# Patient Record
Sex: Female | Born: 1965
Health system: Southern US, Community
[De-identification: ages and names within clinical notes are randomized; demographics above are authoritative.]

## PROBLEM LIST (undated history)

## (undated) DIAGNOSIS — R87619 Unspecified abnormal cytological findings in specimens from cervix uteri: Secondary | ICD-10-CM

## (undated) DIAGNOSIS — H9190 Unspecified hearing loss, unspecified ear: Secondary | ICD-10-CM

## (undated) DIAGNOSIS — E785 Hyperlipidemia, unspecified: Secondary | ICD-10-CM

## (undated) HISTORY — DX: Unspecified hearing loss, unspecified ear: H91.90

## (undated) HISTORY — DX: Unspecified abnormal cytological findings in specimens from cervix uteri: R87.619

## (undated) HISTORY — PX: LEEP: SHX91

## (undated) HISTORY — PX: OVARIAN CYST REMOVAL: SHX89

## (undated) HISTORY — DX: Hyperlipidemia, unspecified: E78.5

---

## 2017-12-21 ENCOUNTER — Ambulatory Visit: Payer: Self-pay | Admitting: Family Medicine

## 2017-12-21 ENCOUNTER — Ambulatory Visit (INDEPENDENT_AMBULATORY_CARE_PROVIDER_SITE_OTHER): Payer: No Typology Code available for payment source | Admitting: Family Medicine

## 2017-12-21 VITALS — BP 133/81 | HR 90 | Resp 17 | Ht 63.0 in | Wt 125.8 lb

## 2017-12-21 DIAGNOSIS — L989 Disorder of the skin and subcutaneous tissue, unspecified: Secondary | ICD-10-CM | POA: Diagnosis not present

## 2017-12-21 DIAGNOSIS — Z1389 Encounter for screening for other disorder: Secondary | ICD-10-CM | POA: Diagnosis not present

## 2017-12-21 DIAGNOSIS — Z13220 Encounter for screening for lipoid disorders: Secondary | ICD-10-CM | POA: Diagnosis not present

## 2017-12-21 DIAGNOSIS — Z8249 Family history of ischemic heart disease and other diseases of the circulatory system: Secondary | ICD-10-CM

## 2017-12-21 DIAGNOSIS — E559 Vitamin D deficiency, unspecified: Secondary | ICD-10-CM

## 2017-12-21 DIAGNOSIS — Z0000 Encounter for general adult medical examination without abnormal findings: Secondary | ICD-10-CM

## 2017-12-21 DIAGNOSIS — Z7689 Persons encountering health services in other specified circumstances: Secondary | ICD-10-CM

## 2017-12-21 DIAGNOSIS — F4322 Adjustment disorder with anxiety: Secondary | ICD-10-CM

## 2017-12-21 DIAGNOSIS — Z1239 Encounter for other screening for malignant neoplasm of breast: Secondary | ICD-10-CM

## 2017-12-21 DIAGNOSIS — Z1321 Encounter for screening for nutritional disorder: Secondary | ICD-10-CM

## 2017-12-21 DIAGNOSIS — Z131 Encounter for screening for diabetes mellitus: Secondary | ICD-10-CM

## 2017-12-21 LAB — POCT URINALYSIS DIP (CLINITEK)
BILIRUBIN UA: NEGATIVE
Glucose, UA: NEGATIVE mg/dL
Ketones, POC UA: NEGATIVE mg/dL
Nitrite, UA: NEGATIVE
POC PROTEIN,UA: NEGATIVE
Spec Grav, UA: 1.025 (ref 1.010–1.025)
Urobilinogen, UA: 0.2 E.U./dL
pH, UA: 6 (ref 5.0–8.0)

## 2017-12-21 NOTE — Patient Instructions (Addendum)
Thank you for choosing [facility] Care at [facility] to be your medical home!    Rebecca Mcdowell was seen by Molli Barrows, FNP today.   Rebecca Mcdowell's primary care provider is Scot Jun, FNP.   For the best care possible, you should try to see Molli Barrows, FNP-C whenever you come to the clinic.   We look forward to seeing you again soon!  If you have any questions about your visit today, please call us at [phone] or feel free to reach your primary care provider via [place].     To schedule your breast exam, please call Breast Clinic at   Phone: [phone]   To schedule an Eye exam [facility]    Health Maintenance, Female Adopting a healthy lifestyle and getting preventive care can go a long way to promote health and wellness. Talk with your health care provider about what schedule of regular examinations is right for you. This is a good chance for you to check in with your provider about disease prevention and staying healthy. In between checkups, there are plenty of things you can do on your own. Experts have done a lot of research about which lifestyle changes and preventive measures are most likely to keep you healthy. Ask your health care provider for more information. Weight and diet Eat a healthy diet  Be sure to include plenty of vegetables, fruits, low-fat dairy products, and lean protein.  Do not eat a lot of foods high in solid fats, added sugars, or salt.  Get regular exercise. This is one of the most important things you can do for your health. ? Most adults should exercise for at least 150 minutes each week. The exercise should increase your heart rate and make you sweat (moderate-intensity exercise). ? Most adults should also do strengthening exercises at least twice a week. This is in addition to the moderate-intensity exercise.  Maintain a healthy weight  Body mass index (BMI) is a measurement that can be used to  identify possible weight problems. It estimates body fat based on height and weight. Your health care provider can help determine your BMI and help you achieve or maintain a healthy weight.  For females 46 years of age and older: ? A BMI below 18.5 is considered underweight. ? A BMI of 18.5 to 24.9 is normal. ? A BMI of 25 to 29.9 is considered overweight. ? A BMI of 30 and above is considered obese.  Watch levels of cholesterol and blood lipids  You should start having your blood tested for lipids and cholesterol at 50 years of age, then have this test every 5 years.  You may need to have your cholesterol levels checked more often if: ? Your lipid or cholesterol levels are high. ? You are older than 50 years of age. ? You are at high risk for heart disease.  Cancer screening Lung Cancer  Lung cancer screening is recommended for adults 80-31 years old who are at high risk for lung cancer because of a history of smoking.  A yearly low-dose CT scan of the lungs is recommended for people who: ? Currently smoke. ? Have quit within the past 15 years. ? Have at least a 30-pack-year history of smoking. A pack year is smoking an average of one pack of cigarettes a day for 1 year.  Yearly screening should continue until it has been 15 years since you quit.  Yearly screening should stop if you develop a  health problem that would prevent you from having lung cancer treatment.  Breast Cancer  Practice breast self-awareness. This means understanding how your breasts normally appear and feel.  It also means doing regular breast self-exams. Let your health care provider know about any changes, no matter how small.  If you are in your 20s or 30s, you should have a clinical breast exam (CBE) by a health care provider every 1-3 years as part of a regular health exam.  If you are 74 or older, have a CBE every year. Also consider having a breast X-ray (mammogram) every year.  If you have a  family history of breast cancer, talk to your health care provider about genetic screening.  If you are at high risk for breast cancer, talk to your health care provider about having an MRI and a mammogram every year.  Breast cancer gene (BRCA) assessment is recommended for women who have family members with BRCA-related cancers. BRCA-related cancers include: ? Breast. ? Ovarian. ? Tubal. ? Peritoneal cancers.  Results of the assessment will determine the need for genetic counseling and BRCA1 and BRCA2 testing.  Cervical Cancer Your health care provider may recommend that you be screened regularly for cancer of the pelvic organs (ovaries, uterus, and vagina). This screening involves a pelvic examination, including checking for microscopic changes to the surface of your cervix (Pap test). You may be encouraged to have this screening done every 3 years, beginning at age 73.  For women ages 67-65, health care providers may recommend pelvic exams and Pap testing every 3 years, or they may recommend the Pap and pelvic exam, combined with testing for human papilloma virus (HPV), every 5 years. Some types of HPV increase your risk of cervical cancer. Testing for HPV may also be done on women of any age with unclear Pap test results.  Other health care providers may not recommend any screening for nonpregnant women who are considered low risk for pelvic cancer and who do not have symptoms. Ask your health care provider if a screening pelvic exam is right for you.  If you have had past treatment for cervical cancer or a condition that could lead to cancer, you need Pap tests and screening for cancer for at least 20 years after your treatment. If Pap tests have been discontinued, your risk factors (such as having a new sexual partner) need to be reassessed to determine if screening should resume. Some women have medical problems that increase the chance of getting cervical cancer. In these cases, your  health care provider may recommend more frequent screening and Pap tests.  Colorectal Cancer  This type of cancer can be detected and often prevented.  Routine colorectal cancer screening usually begins at 50 years of age and continues through 50 years of age.  Your health care provider may recommend screening at an earlier age if you have risk factors for colon cancer.  Your health care provider may also recommend using home test kits to check for hidden blood in the stool.  A small camera at the end of a tube can be used to examine your colon directly (sigmoidoscopy or colonoscopy). This is done to check for the earliest forms of colorectal cancer.  Routine screening usually begins at age 23.  Direct examination of the colon should be repeated every 5-10 years through 50 years of age. However, you may need to be screened more often if early forms of precancerous polyps or small growths are found.  Skin  Cancer  Check your skin from head to toe regularly.  Tell your health care provider about any new moles or changes in moles, especially if there is a change in a mole's shape or color.  Also tell your health care provider if you have a mole that is larger than the size of a pencil eraser.  Always use sunscreen. Apply sunscreen liberally and repeatedly throughout the day.  Protect yourself by wearing long sleeves, pants, a wide-brimmed hat, and sunglasses whenever you are outside.  Heart disease, diabetes, and high blood pressure  High blood pressure causes heart disease and increases the risk of stroke. High blood pressure is more likely to develop in: ? People who have blood pressure in the high end of the normal range (130-139/85-89 mm Hg). ? People who are overweight or obese. ? People who are African American.  If you are 72-62 years of age, have your blood pressure checked every 3-5 years. If you are 21 years of age or older, have your blood pressure checked every year. You  should have your blood pressure measured twice-once when you are at a hospital or clinic, and once when you are not at a hospital or clinic. Record the average of the two measurements. To check your blood pressure when you are not at a hospital or clinic, you can use: ? An automated blood pressure machine at a pharmacy. ? A home blood pressure monitor.  If you are between 69 years and 45 years old, ask your health care provider if you should take aspirin to prevent strokes.  Have regular diabetes screenings. This involves taking a blood sample to check your fasting blood sugar level. ? If you are at a normal weight and have a low risk for diabetes, have this test once every three years after 50 years of age. ? If you are overweight and have a high risk for diabetes, consider being tested at a younger age or more often. Preventing infection Hepatitis B  If you have a higher risk for hepatitis B, you should be screened for this virus. You are considered at high risk for hepatitis B if: ? You were born in a country where hepatitis B is common. Ask your health care provider which countries are considered high risk. ? Your parents were born in a high-risk country, and you have not been immunized against hepatitis B (hepatitis B vaccine). ? You have HIV or AIDS. ? You use needles to inject street drugs. ? You live with someone who has hepatitis B. ? You have had sex with someone who has hepatitis B. ? You get hemodialysis treatment. ? You take certain medicines for conditions, including cancer, organ transplantation, and autoimmune conditions.  Hepatitis C  Blood testing is recommended for: ? Everyone born from [birth year] through 1963. ? Anyone with known risk factors for hepatitis C.  Sexually transmitted infections (STIs)  You should be screened for sexually transmitted infections (STIs) including gonorrhea and chlamydia if: ? You are sexually active and are younger than 50 years of age. ? You  are older than 50 years of age and your health care provider tells you that you are at risk for this type of infection. ? Your sexual activity has changed since you were last screened and you are at an increased risk for chlamydia or gonorrhea. Ask your health care provider if you are at risk.  If you do not have HIV, but are at risk, it may be recommended that you take  a prescription medicine daily to prevent HIV infection. This is called pre-exposure prophylaxis (PrEP). You are considered at risk if: ? You are sexually active and do not regularly use condoms or know the HIV status of your partner(s). ? You take drugs by injection. ? You are sexually active with a partner who has HIV.  Talk with your health care provider about whether you are at high risk of being infected with HIV. If you choose to begin PrEP, you should first be tested for HIV. You should then be tested every 3 months for as long as you are taking PrEP. Pregnancy  If you are premenopausal and you may become pregnant, ask your health care provider about preconception counseling.  If you may become pregnant, take 400 to 800 micrograms (mcg) of folic acid every day.  If you want to prevent pregnancy, talk to your health care provider about birth control (contraception). Osteoporosis and menopause  Osteoporosis is a disease in which the bones lose minerals and strength with aging. This can result in serious bone fractures. Your risk for osteoporosis can be identified using a bone density scan.  If you are 68 years of age or older, or if you are at risk for osteoporosis and fractures, ask your health care provider if you should be screened.  Ask your health care provider whether you should take a calcium or vitamin D supplement to lower your risk for osteoporosis.  Menopause may have certain physical symptoms and risks.  Hormone replacement therapy may reduce some of these symptoms and risks. Talk to your health care  provider about whether hormone replacement therapy is right for you. Follow these instructions at home:  Schedule regular health, dental, and eye exams.  Stay current with your immunizations.  Do not use any tobacco products including cigarettes, chewing tobacco, or electronic cigarettes.  If you are pregnant, do not drink alcohol.  If you are breastfeeding, limit how much and how often you drink alcohol.  Limit alcohol intake to no more than 1 drink per day for nonpregnant women. One drink equals 12 ounces of beer, 5 ounces of wine, or 1 ounces of hard liquor.  Do not use street drugs.  Do not share needles.  Ask your health care provider for help if you need support or information about quitting drugs.  Tell your health care provider if you often feel depressed.  Tell your health care provider if you have ever been abused or do not feel safe at home. This information is not intended to replace advice given to you by your health care provider. Make sure you discuss any questions you have with your health care provider. Document Released: 07/19/2010 Document Revised: 06/11/2015 Document Reviewed: 10/07/2014 Elsevier Interactive Patient Education  [employer].

## 2017-12-21 NOTE — Progress Notes (Deleted)
Rebecca Mcdowell, is a 52 y.o. female  [id number]  [id number]  DOB - 08-18-1965  CC:  Chief Complaint  Patient presents with  . Establish Care  . Annual Exam    not fasting       HPI: Rebecca Mcdowell is a 52 y.o. female is here today to establish care.   Rebecca Mcdowell does not have a problem list on file.    Today's visit:    Patient denies new headaches, chest pain, abdominal pain, nausea, new weakness , numbness or tingling, SOB, edema, or worrisome cough.     Current medications:No current outpatient medications on file.   Pertinent family medical history: family history includes Breast cancer in her mother; Heart attack in her paternal grandmother; Hyperlipidemia in her father and mother; Hypertension in her father, mother, and paternal grandmother; Rheum arthritis in her mother.   No Known Allergies  Social History   Socioeconomic History  . Marital status: Married    Spouse name: Not on file  . Number of children: Not on file  . Years of education: Not on file  . Highest education level: Not on file  Occupational History  . Not on file  Social Needs  . Financial resource strain: Not on file  . Food insecurity:    Worry: Not on file    Inability: Not on file  . Transportation needs:    Medical: Not on file    Non-medical: Not on file  Tobacco Use  . Smoking status: Never Smoker  . Smokeless tobacco: Never Used  Substance and Sexual Activity  . Alcohol use: Yes    Comment: socially  . Drug use: Not on file  . Sexual activity: Yes    Partners: Male  Lifestyle  . Physical activity:    Days per week: Not on file    Minutes per session: Not on file  . Stress: Not on file  Relationships  . Social connections:    Talks on phone: Not on file    Gets together: Not on file    Attends religious service: Not on file    Active member of club or organization: Not on file    Attends meetings of clubs or organizations: Not on file    Relationship status:  Not on file  . Intimate partner violence:    Fear of current or ex partner: Not on file    Emotionally abused: Not on file    Physically abused: Not on file    Forced sexual activity: Not on file  Other Topics Concern  . Not on file  Social History Narrative  . Not on file    Review of Systems: Constitutional: Negative for fever, chills, diaphoresis, activity change, appetite change and fatigue. HENT: Negative for ear pain, nosebleeds, congestion, facial swelling, rhinorrhea, neck pain, neck stiffness and ear discharge.  Eyes: Negative for pain, discharge, redness, itching and visual disturbance. Respiratory: Negative for cough, choking, chest tightness, shortness of breath, wheezing and stridor.  Cardiovascular: Negative for chest pain, palpitations and leg swelling. Gastrointestinal: Negative for abdominal distention. Genitourinary: Negative for dysuria, urgency, frequency, hematuria, flank pain, decreased urine volume, difficulty urinating. Musculoskeletal: Negative for back pain, joint swelling, arthralgia and gait problem. Neurological: Negative for dizziness, tremors, seizures, syncope, facial asymmetry, speech difficulty, weakness, light-headedness, numbness and headaches.  Hematological: Negative for adenopathy. Does not bruise/bleed easily. Psychiatric/Behavioral: Negative for hallucinations, behavioral problems, confusion, dysphoric mood, decreased concentration and agitation.    Objective:   Vitals:   12/21/17  1105  BP: 133/81  Pulse: 90  Resp: 17  SpO2: 98%    BP Readings from Last 3 Encounters:  12/21/17 133/81    Filed Weights   12/21/17 1105  Weight: 125 lb 12.8 oz (57.1 kg)      Physical Exam: Constitutional: Patient appears well-developed and well-nourished. No distress. HENT: Normocephalic, atraumatic, External right and left ear normal. Oropharynx is clear and moist.  Eyes: Conjunctivae and EOM are normal. PERRLA, no scleral icterus. Neck: Normal  ROM. Neck supple. No JVD. No tracheal deviation. No thyromegaly. CVS: RRR, S1/S2 +, no murmurs, no gallops, no carotid bruit.  Pulmonary: Effort and breath sounds normal, no stridor, rhonchi, wheezes, rales.  Abdominal: Soft. BS +, no distension, tenderness, rebound or guarding.  Musculoskeletal: Normal range of motion. No edema and no tenderness.  Neuro: Alert. Normal muscle tone coordination. Normal gait. BUE and BLE strength 5/5. Bilateral hand grips symmetrical. No cranial nerve deficit. Skin: Skin is warm and dry. No rash noted. Not diaphoretic. No erythema. No pallor. Psychiatric: Normal mood and affect. Behavior, judgment, thought content normal.  Lab Results (prior encounters)  No results found for: WBC, HGB, HCT, MCV, PLT No results found for: CREATININE, BUN, NA, K, CL, CO2  No results found for: HGBA1C  No results found for: CHOL, TRIG, HDL, CHOLHDL, VLDL, LDLCALC      Assessment and plan:  1. Encounter to establish care ***  2. Annual physical exam ***   No follow-ups on file.   The patient was given clear instructions to go to ER or return to medical center if symptoms don't improve, worsen or new problems develop. The patient verbalized understanding. The patient was advised  to call and obtain lab results if they haven't heard anything from out office within 7-10 business days.  Molli Barrows, FNP [facility] at [facility] [address], [place] [address] [phone]: [phone]    This note has been created with Dragon speech recognition software and Engineer, materials. Any transcriptional errors are unintentional.

## 2017-12-22 LAB — CBC WITH DIFFERENTIAL/PLATELET
Basophils Absolute: 0.1 10*3/uL (ref 0.0–0.2)
Basos: 1 %
EOS (ABSOLUTE): 0.1 10*3/uL (ref 0.0–0.4)
Eos: 2 %
Hematocrit: 39.2 % (ref 34.0–46.6)
Hemoglobin: 14.2 g/dL (ref 11.1–15.9)
IMMATURE GRANULOCYTES: 0 %
Immature Grans (Abs): 0 10*3/uL (ref 0.0–0.1)
Lymphocytes Absolute: 1.5 10*3/uL (ref 0.7–3.1)
Lymphs: 28 %
MCH: 33.7 pg — ABNORMAL HIGH (ref 26.6–33.0)
MCHC: 36.2 g/dL — ABNORMAL HIGH (ref 31.5–35.7)
MCV: 93 fL (ref 79–97)
Monocytes Absolute: 0.5 10*3/uL (ref 0.1–0.9)
Monocytes: 9 %
Neutrophils Absolute: 3.2 10*3/uL (ref 1.4–7.0)
Neutrophils: 60 %
Platelets: 269 10*3/uL (ref 150–450)
RBC: 4.21 x10E6/uL (ref 3.77–5.28)
RDW: 14.2 % (ref 12.3–15.4)
WBC: 5.4 10*3/uL (ref 3.4–10.8)

## 2017-12-22 LAB — COMPREHENSIVE METABOLIC PANEL
ALT: 23 IU/L (ref 0–32)
AST: 20 IU/L (ref 0–40)
Albumin/Globulin Ratio: 1.6 (ref 1.2–2.2)
Albumin: 4.5 g/dL (ref 3.5–5.5)
Alkaline Phosphatase: 62 IU/L (ref 39–117)
BUN/Creatinine Ratio: 30 — ABNORMAL HIGH (ref 9–23)
BUN: 21 mg/dL (ref 6–24)
Bilirubin Total: <0.2 mg/dL (ref 0.0–1.2)
CO2: 24 mmol/L (ref 20–29)
CREATININE: 0.7 mg/dL (ref 0.57–1.00)
Calcium: 10.1 mg/dL (ref 8.7–10.2)
Chloride: 103 mmol/L (ref 96–106)
GFR calc Af Amer: 117 mL/min/{1.73_m2} (ref >59)
GFR calc non Af Amer: 101 mL/min/{1.73_m2} (ref >59)
GLUCOSE: 97 mg/dL (ref 65–99)
Globulin, Total: 2.8 g/dL (ref 1.5–4.5)
Potassium: 4.4 mmol/L (ref 3.5–5.2)
Sodium: 142 mmol/L (ref 134–144)
Total Protein: 7.3 g/dL (ref 6.0–8.5)

## 2017-12-22 LAB — THYROID PANEL WITH TSH
Free Thyroxine Index: 2 (ref 1.2–4.9)
T3 Uptake Ratio: 25 % (ref 24–39)
T4, Total: 7.8 ug/dL (ref 4.5–12.0)
TSH: 1.5 u[IU]/mL (ref 0.450–4.500)

## 2017-12-22 LAB — VITAMIN D 25 HYDROXY (VIT D DEFICIENCY, FRACTURES): Vit D, 25-Hydroxy: 25.9 ng/mL — ABNORMAL LOW (ref 30.0–100.0)

## 2017-12-22 LAB — HEMOGLOBIN A1C
ESTIMATED AVERAGE GLUCOSE: 114 mg/dL
Hgb A1c MFr Bld: 5.6 % (ref 4.8–5.6)

## 2017-12-22 LAB — FSH/LH
FSH: 78.3 m[IU]/mL
LH: 36.1 m[IU]/mL

## 2017-12-22 MED ORDER — VITAMIN D (ERGOCALCIFEROL) 1.25 MG (50000 UNIT) PO CAPS: 50000.0000 [IU] | ORAL_CAPSULE | ORAL | 0 refills | Status: DC

## 2017-12-22 NOTE — Progress Notes (Signed)
Notify patient all lab results within expected range with the exception her vitamin D level is low. I have prescribed vitamin d replacement 50,000 units once per week x 12 weeks. I will like for her to have a repeat vitamin D level completed in 14 weeks. Schedule lab appointment. I also encourage intake of food items such as diary products and cereals which indicate fortified with vitamin D.

## 2017-12-25 ENCOUNTER — Other Ambulatory Visit: Payer: Self-pay

## 2017-12-25 ENCOUNTER — Other Ambulatory Visit: Payer: No Typology Code available for payment source

## 2017-12-25 DIAGNOSIS — Z13220 Encounter for screening for lipoid disorders: Secondary | ICD-10-CM

## 2017-12-25 MED ORDER — VITAMIN D (ERGOCALCIFEROL) 1.25 MG (50000 UNIT) PO CAPS: 50000.0000 [IU] | ORAL_CAPSULE | ORAL | 0 refills | Status: DC

## 2017-12-26 LAB — LIPID PANEL
Chol/HDL Ratio: 5.1 ratio — ABNORMAL HIGH (ref 0.0–4.4)
Cholesterol, Total: 293 mg/dL — ABNORMAL HIGH (ref 100–199)
HDL: 58 mg/dL (ref >39)
LDL Calculated: 212 mg/dL — ABNORMAL HIGH (ref 0–99)
Triglycerides: 115 mg/dL (ref 0–149)
VLDL Cholesterol Cal: 23 mg/dL (ref 5–40)

## 2017-12-26 NOTE — Progress Notes (Signed)
Patient notified of results & recommendations during lab appointment. Expressed understanding.

## 2017-12-28 DIAGNOSIS — F411 Generalized anxiety disorder: Secondary | ICD-10-CM | POA: Insufficient documentation

## 2017-12-28 NOTE — Progress Notes (Signed)
Patient ID: Rebecca Mcdowell, female    DOB: [birth year], 52 y.o.   MRN: [id number]  PCP: Scot Jun, FNP  Chief Complaint  Patient presents with  . Establish Care  . Annual Exam    not fasting    Subjective:  HPI Rebecca Mcdowell is a 52 y.o. female, nonsmoker, is here for complete physical exam.   Denies any significant medical history.  Reports sometime ago she was screened and was advised that her lipid panel was elevated.  At that time the recommended medication however she has not had a recent lipid check in a while.  She endorses poor dietary choices.  She does not routinely exercise.  She has a positive family history of hypertension with both parents.  Had a history of elevation in blood pressure.  She recently relocated from [place] to [place], and expresses some difficulty with adjusting to recent move.  She is requesting a dermatology referral as she has a skin lesion on her chest and has been followed previously by Derm.  No history of skin cancer.  She is in need of a routine mammogram.  She is currently looking for employment.  She has had a Pap within the last 3 year and uncertain of date..  She is in the process of scheduling an eye exam and dental visit.  Chronic conditions include: Patient Active Problem List   Diagnosis Date Noted  . Generalized anxiety disorder 12/28/2017      Current home medications include: Prior to Admission medications   Medication Sig Start Date End Date Taking? Authorizing Provider  Vitamin D, Ergocalciferol, (DRISDOL) 1.25 MG (50000 UT) CAPS capsule Take 1 capsule (50,000 Units total) by mouth every 7 (seven) days. 12/25/17   Scot Jun, FNP    Family History  Problem Relation Age of Onset  . Hypertension Mother   . Hyperlipidemia Mother   . Breast cancer Mother   . Rheum arthritis Mother   . Hypertension Father   . Hyperlipidemia Father   . Heart attack Paternal Grandmother   . Hypertension Paternal  Grandmother      No Known Allergies  Social History   Socioeconomic History  . Marital status: Married    Spouse name: Not on file  . Number of children: Not on file  . Years of education: Not on file  . Highest education level: Not on file  Occupational History  . Not on file  Social Needs  . Financial resource strain: Not on file  . Food insecurity:    Worry: Not on file    Inability: Not on file  . Transportation needs:    Medical: Not on file    Non-medical: Not on file  Tobacco Use  . Smoking status: Never Smoker  . Smokeless tobacco: Never Used  Substance and Sexual Activity  . Alcohol use: Yes    Comment: socially  . Drug use: Not on file  . Sexual activity: Yes    Partners: Male  Lifestyle  . Physical activity:    Days per week: Not on file    Minutes per session: Not on file  . Stress: Not on file  Relationships  . Social connections:    Talks on phone: Not on file    Gets together: Not on file    Attends religious service: Not on file    Active member of club or organization: Not on file    Attends meetings of clubs or organizations: Not on file  Relationship status: Not on file  . Intimate partner violence:    Fear of current or ex partner: Not on file    Emotionally abused: Not on file    Physically abused: Not on file    Forced sexual activity: Not on file  Other Topics Concern  . Not on file  Social History Narrative  . Not on file    Review of Systems Constitutional: Negative for fever, chills, diaphoresis, activity change, appetite change and fatigue. HENT: Negative for ear pain, nosebleeds, congestion, facial swelling, rhinorrhea, neck pain, neck stiffness and ear discharge.  Eyes: Negative for pain, discharge, redness, itching and visual disturbance. Respiratory: Negative for cough, choking, chest tightness, shortness of breath, wheezing and stridor.  Cardiovascular: Negative for chest pain, palpitations and leg  swelling. Gastrointestinal: Negative for abdominal distention. Genitourinary: Negative for dysuria, urgency, frequency, hematuria, flank pain, decreased urine volume, difficulty urinating and dyspareunia.  Musculoskeletal: Negative for back pain, joint swelling, arthralgia and gait problem. Neurological: Negative for dizziness, tremors, seizures, syncope, facial asymmetry, speech difficulty, weakness, light-headedness, numbness and headaches.  Hematological: Negative for adenopathy. Does not bruise/bleed easily. Psychiatric/Behavioral: Negative for hallucinations, behavioral problems, confusion, dysphoric mood, decreased concentration and agitation.   Past Medical, Surgical Family and Social History reviewed and updated.  Objective:   Today's Vitals   12/21/17 1105  BP: 133/81  Pulse: 90  Resp: 17  SpO2: 98%  Weight: 125 lb 12.8 oz (57.1 kg)  Height: 5\' 3"  (1.6 m)    Wt Readings from Last 3 Encounters:  12/21/17 125 lb 12.8 oz (57.1 kg)    Physical Exam Constitutional: Patient appears well-developed and well-nourished. No distress. HENT: Normocephalic, atraumatic, External right and left ear normal. Oropharynx is clear and moist.  Eyes: Conjunctivae and EOM are normal. PERRLA, no scleral icterus. Neck: Normal ROM. Neck supple. No JVD. No tracheal deviation. No thyromegaly. CVS: RRR, S1/S2 +, no murmurs, no gallops, no carotid bruit.  Pulmonary: Effort and breath sounds normal, no stridor, rhonchi, wheezes, rales.  Abdominal: Soft. BS +, no distension, tenderness, rebound or guarding.  Musculoskeletal: Normal range of motion. No edema and no tenderness.  Lymphadenopathy: No lymphadenopathy noted, cervical, inguinal or axillary Neuro: Alert. Normal reflexes, muscle tone coordination. No cranial nerve deficit. Skin: Skin is warm and dry. No rash noted. Not diaphoretic. No erythema. No pallor. Psychiatric: Normal mood and affect. Behavior, judgment, thought content normal.     Assessment & Plan:  1. Encounter to establish care 2. Annual physical exam -Age appropriate anticipatory guidance provided  - POCT URINALYSIS DIP (CLINITEK) - CBC with Differential - EKG 12-Lead - Ambulatory referral to Obstetrics / Gynecology, routine gynecology care.  3. Screening, lipid - Thyroid Panel With TSH - Lipid panel; Future  4. Screening for diabetes mellitus - Comprehensive metabolic panel - Hemoglobin A1c  5. Screening breast examination - MM Digital Screening; Future  6. Encounter for vitamin deficiency screening - VITAMIN D 25 Hydroxy (Vit-D Deficiency, Fractures)  7. Anxious mood as adjustment reaction - Thyroid Panel With TSH - FSH/LH  8. Changing skin lesion chest  - Ambulatory referral to Dermatology  9. Vitamin D deficiency - VITAMIN D 25 Hydroxy (Vit-D Deficiency, Fractures); Future    Orders Placed This Encounter  Procedures  . MM Digital Screening    Standing Status:   Future    Standing Expiration Date:   02/22/2019    Order Specific Question:   Reason for Exam (SYMPTOM  OR DIAGNOSIS REQUIRED)    Answer:  routine breast screening    Order Specific Question:   Is the patient pregnant?    Answer:   No    Order Specific Question:   Preferred imaging location?    Answer:   [facility]  . Thyroid Panel With TSH  . FSH/LH  . CBC with Differential  . Comprehensive metabolic panel    Order Specific Question:   Has the patient fasted?    Answer:   No  . Hemoglobin A1c  . VITAMIN D 25 Hydroxy (Vit-D Deficiency, Fractures)  . Lipid panel    Standing Status:   Future    Number of Occurrences:   1    Standing Expiration Date:   12/22/2018    Order Specific Question:   Has the patient fasted?    Answer:   No  . VITAMIN D 25 Hydroxy (Vit-D Deficiency, Fractures)    Standing Status:   Future    Standing Expiration Date:   12/23/2018  . Ambulatory referral to Obstetrics / Gynecology    Referral Priority:   Routine    Referral Type:    Consultation    Referral Reason:   Specialty Services Required    Requested Specialty:   Obstetrics and Gynecology    Number of Visits Requested:   1  . Ambulatory referral to Dermatology    Referral Priority:   Routine    Referral Type:   Consultation    Referral Reason:   Specialty Services Required    Requested Specialty:   Dermatology    Number of Visits Requested:   1  . POCT URINALYSIS DIP (CLINITEK)  . EKG 12-Lead   Return for fasting lipid panel. Follow-up as needed and recommend annual physical exam.   Molli Barrows, FNP Primary Care at [facility] [address], [place] [phone]: [phone]

## 2017-12-29 ENCOUNTER — Telehealth: Payer: Self-pay

## 2017-12-29 MED ORDER — ATORVASTATIN CALCIUM 20 MG PO TABS: 20.0000 mg | ORAL_TABLET | Freq: Every day | ORAL | 3 refills | Status: DC

## 2017-12-29 NOTE — Telephone Encounter (Signed)
Just wanted to confirm the verbal recommendations based off of patient's recent numbers as well as medical history.

## 2017-12-30 ENCOUNTER — Telehealth: Payer: Self-pay | Admitting: Family Medicine

## 2017-12-30 NOTE — Telephone Encounter (Signed)
erroneously sent lab results to Dr. Nolon Rod. This patient is a patient of record here at Primary Care at [facility].  I am resuming previously recommended statin therapy with atorvastatin 20 mg once daily. Patient already notified by phone.

## 2018-01-04 MED FILL — ATORVASTATIN CALCIUM 20 MG: 20 | 90 days supply | Qty: 90 | Fill #0

## 2018-01-09 ENCOUNTER — Ambulatory Visit: Payer: Self-pay | Admitting: Family Medicine

## 2018-01-19 ENCOUNTER — Ambulatory Visit: Payer: Self-pay | Admitting: Gynecology

## 2018-02-12 ENCOUNTER — Ambulatory Visit
Admission: RE | Admit: 2018-02-12 | Discharge: 2018-02-12 | Disposition: A | Discharge status: Home / Self Care | Payer: No Typology Code available for payment source | Source: Ambulatory Visit | Attending: Family Medicine | Admitting: Family Medicine

## 2018-02-12 DIAGNOSIS — Z1239 Encounter for other screening for malignant neoplasm of breast: Secondary | ICD-10-CM

## 2018-02-14 ENCOUNTER — Other Ambulatory Visit: Payer: Self-pay | Admitting: Family Medicine

## 2018-02-14 DIAGNOSIS — R928 Other abnormal and inconclusive findings on diagnostic imaging of breast: Secondary | ICD-10-CM

## 2018-02-16 ENCOUNTER — Ambulatory Visit: Admission: RE | Admit: 2018-02-16 | Payer: No Typology Code available for payment source | Source: Ambulatory Visit

## 2018-02-16 ENCOUNTER — Ambulatory Visit
Admission: RE | Admit: 2018-02-16 | Discharge: 2018-02-16 | Disposition: A | Discharge status: Home / Self Care | Payer: No Typology Code available for payment source | Source: Ambulatory Visit | Attending: Family Medicine | Admitting: Family Medicine

## 2018-02-16 DIAGNOSIS — R928 Other abnormal and inconclusive findings on diagnostic imaging of breast: Secondary | ICD-10-CM

## 2018-02-23 ENCOUNTER — Encounter: Payer: Self-pay | Admitting: Obstetrics & Gynecology

## 2018-02-23 ENCOUNTER — Ambulatory Visit (INDEPENDENT_AMBULATORY_CARE_PROVIDER_SITE_OTHER): Payer: No Typology Code available for payment source | Admitting: Obstetrics & Gynecology

## 2018-02-23 VITALS — BP 128/82 | Ht 63.0 in | Wt 128.0 lb

## 2018-02-23 DIAGNOSIS — N951 Menopausal and female climacteric states: Secondary | ICD-10-CM

## 2018-02-23 DIAGNOSIS — N914 Secondary oligomenorrhea: Secondary | ICD-10-CM | POA: Diagnosis not present

## 2018-02-23 DIAGNOSIS — Z01419 Encounter for gynecological examination (general) (routine) without abnormal findings: Secondary | ICD-10-CM | POA: Diagnosis not present

## 2018-02-23 DIAGNOSIS — B977 Papillomavirus as the cause of diseases classified elsewhere: Secondary | ICD-10-CM | POA: Diagnosis not present

## 2018-02-23 DIAGNOSIS — N72 Inflammatory disease of cervix uteri: Secondary | ICD-10-CM | POA: Diagnosis not present

## 2018-02-23 NOTE — Addendum Note (Signed)
Addended by: Franchot Gallo on: 02/23/2018 12:52 PM   Modules accepted: Orders

## 2018-02-23 NOTE — Progress Notes (Signed)
Rebecca Mcdowell May 16, 1965 [id number]   History:    53 y.o. G0 Married.  Moved from [place] 11/2017.  RP:  New patient presenting for annual gyn exam   HPI: Patient had regular normal manses every month until October 2019.  Since moved from [place] in November 2019, as had 2 days of light bleeding mid December and no bleeding since then.  Occasional hot flashes and patient feels more sweaty.  No pelvic pain.  Abstinent for many years.  History of positive high-risk HPV with abnormal Pap tests and and cervical dysplasia.  Probably had a LEEP in 91.  Per patient, she was offered a hysterectomy a few times by her gynecologist in [place].  Will obtain records of colposcopies and cervical treatments from [place].  Breast normal.  Recent mammogram January 2020 was negative at the breast center.  Health labs with family physician.  Patient is scheduling a colonoscopy through her family physician.  Body mass index 22.67.  Healthy nutrition and good fitness.  Past medical history,surgical history, family history and social history were all reviewed and documented in the EPIC chart.  Gynecologic History Patient's last menstrual period was 12/31/2017. Contraception: abstinence Last Pap: H/O Abnormal Paps, HPV HR positive, Cervical Dysplasia.  Will obtain records from [place]. Last mammogram: 01/2018. Results were: Negative Bone Density: Never Colonoscopy: Will schedule now  Obstetric History OB History  Gravida Para Term Preterm AB Living               SAB TAB Ectopic Multiple Live Births          0     ROS: A ROS was performed and pertinent positives and negatives are included in the history.  GENERAL: No fevers or chills. HEENT: No change in vision, no earache, sore throat or sinus congestion. NECK: No pain or stiffness. CARDIOVASCULAR: No chest pain or pressure. No palpitations. PULMONARY: No shortness of breath, cough or wheeze. GASTROINTESTINAL: No abdominal pain, nausea, vomiting or  diarrhea, melena or bright red blood per rectum. GENITOURINARY: No urinary frequency, urgency, hesitancy or dysuria. MUSCULOSKELETAL: No joint or muscle pain, no back pain, no recent trauma. DERMATOLOGIC: No rash, no itching, no lesions. ENDOCRINE: No polyuria, polydipsia, no heat or cold intolerance. No recent change in weight. HEMATOLOGICAL: No anemia or easy bruising or bleeding. NEUROLOGIC: No headache, seizures, numbness, tingling or weakness. PSYCHIATRIC: No depression, no loss of interest in normal activity or change in sleep pattern.     Exam:   BP 128/82   Ht 5\' 3"  (1.6 m)   Wt 128 lb (58.1 kg)   LMP 12/31/2017 Comment: spotting  BMI 22.67 kg/m   Body mass index is 22.67 kg/m.  General appearance : Well developed well nourished female. No acute distress HEENT: Eyes: no retinal hemorrhage or exudates,  Neck supple, trachea midline, no carotid bruits, no thyroidmegaly Lungs: Clear to auscultation, no rhonchi or wheezes, or rib retractions  Heart: Regular rate and rhythm, no murmurs or gallops Breast:Examined in sitting and supine position were symmetrical in appearance, no palpable masses or tenderness,  no skin retraction, no nipple inversion, no nipple discharge, no skin discoloration, no axillary or supraclavicular lymphadenopathy Abdomen: no palpable masses or tenderness, no rebound or guarding Extremities: no edema or skin discoloration or tenderness  Pelvic: Vulva: Normal             Vagina: No gross lesions or discharge  Cervix: No gross lesions or discharge.  Pap/HPV HR done.  Uterus  AV,  normal size, shape and consistency, non-tender and mobile  Adnexa  Without masses or tenderness  Anus: Normal   Assessment/Plan:  53 y.o. female for annual exam   1. Encounter for routine gynecological examination with Papanicolaou smear of cervix Normal gynecologic exam.  Pap test with high-risk HPV done today.  Breast exam normal.  Screening mammogram January 2020 negative.   Health labs with family physician.  Will schedule a colonoscopy through her family physician.  Good body mass index at 22.67.  Continue with fitness and healthy nutrition.  2. High risk human papilloma virus (HPV) infection of cervix  3. Perimenopausal Probably in perimenopause explaining her irregular bleeding with mild spotting mid December 2011 and no regular menstrual period since October otherwise.  Will confirm her status with an [facility] today and will follow up with a pelvic ultrasound to assess the endometrium.  Rule out endometrial pathology such as polyps, submucosal fibroid, endometrial hyperplasia and endometrial cancer. - FSH - US Transvaginal Non-OB; Future  4. Secondary oligomenorrhea As above. - US Transvaginal Non-OB; Future  Counseling on above issues and coordination of care more than 50% for 10 minutes.  [name] MD, 12:16 PM 02/23/2018

## 2018-02-23 NOTE — Patient Instructions (Signed)
1. Encounter for routine gynecological examination with Papanicolaou smear of cervix Normal gynecologic exam.  Pap test with high-risk HPV done today.  Breast exam normal.  Screening mammogram January 2020 negative.  Health labs with family physician.  Will schedule a colonoscopy through her family physician.  Good body mass index at 22.67.  Continue with fitness and healthy nutrition.  2. High risk human papilloma virus (HPV) infection of cervix  3. Perimenopausal Probably in perimenopause explaining her irregular bleeding with mild spotting mid December 2011 and no regular menstrual period since October otherwise.  Will confirm her status with an [facility] today and will follow up with a pelvic ultrasound to assess the endometrium.  Rule out endometrial pathology such as polyps, submucosal fibroid, endometrial hyperplasia and endometrial cancer. - FSH - US Transvaginal Non-OB; Future  4. Secondary oligomenorrhea As above. - US Transvaginal Non-OB; Future  Rebecca Mcdowell, it was a pleasure seeing you today!  I will inform you of your results as soon as they are available.

## 2018-02-24 LAB — FOLLICLE STIMULATING HORMONE: FSH: 12.6 m[IU]/mL

## 2018-03-01 ENCOUNTER — Telehealth: Payer: Self-pay

## 2018-03-01 LAB — PAP, TP IMAGING W/ HPV RNA, RFLX HPV TYPE 16,18/45: HPV DNA High Risk: DETECTED — AB

## 2018-03-01 LAB — HPV TYPE 16 AND 18/45 RNA
HPV Type 16 RNA: DETECTED — AB
HPV Type 18/45 RNA: NOT DETECTED

## 2018-03-01 NOTE — Telephone Encounter (Signed)
Call was not for me. Opened in error.

## 2018-03-02 ENCOUNTER — Ambulatory Visit: Payer: No Typology Code available for payment source | Admitting: Obstetrics & Gynecology

## 2018-03-22 ENCOUNTER — Encounter: Payer: Self-pay | Admitting: Obstetrics & Gynecology

## 2018-03-22 ENCOUNTER — Ambulatory Visit (INDEPENDENT_AMBULATORY_CARE_PROVIDER_SITE_OTHER): Payer: No Typology Code available for payment source

## 2018-03-22 ENCOUNTER — Ambulatory Visit: Payer: No Typology Code available for payment source | Admitting: Obstetrics & Gynecology

## 2018-03-22 DIAGNOSIS — N951 Menopausal and female climacteric states: Secondary | ICD-10-CM

## 2018-03-22 DIAGNOSIS — R87612 Low grade squamous intraepithelial lesion on cytologic smear of cervix (LGSIL): Secondary | ICD-10-CM

## 2018-03-22 DIAGNOSIS — N914 Secondary oligomenorrhea: Secondary | ICD-10-CM | POA: Diagnosis not present

## 2018-03-22 NOTE — Progress Notes (Signed)
    Rebecca Mcdowell [birth year] [id number]        53 y.o. G0 Married  RP: Oligomenorrhea for Pelvic US  HPI: Irregular menses with oligomenorrhea.  No pelvic pain.  Recent Pap test LGSIL, HPV HR pos, HPV 16 positive, will schedule Colpo.  H/O LEEP in 1991.     OB History  Gravida Para Term Preterm AB Living               SAB TAB Ectopic Multiple Live Births          0    Past medical history,surgical history, problem list, medications, allergies, family history and social history were all reviewed and documented in the EPIC chart.   Directed ROS with pertinent positives and negatives documented in the history of present illness/assessment and plan.  Exam:  There were no vitals filed for this visit. General appearance:  Normal  Pelvic US today: T/V images.  Anteverted uterus measuring 6.87 x 5.43 x 4.44 cm.  Four fibroids are present.  The 2 largest ones are measured at 3.1 x 2.4 and 2.2 x 1.4 cm.  The endometrial lining is symmetrical at 5.5 mm with no mass or abnormal blood flow seen.  Left ovary surgically absent.  Right ovary with a 3.4 x 2 cm avascular simple cystic structure, nontender.  No free fluid in the posterior cul-de-sac.  [place] on February 23, 2018 was at 12.6   Assessment/Plan:  53 y.o. No obstetric history on file.   1. Perimenopause Oligomenorrhea with intermittent hot flashes and night sweats.  Recent FSH was not in the menopausal range at 12.6.  Pelvic ultrasound findings reviewed with patient.  Reassured that her endometrial lining was normal at 5.5 mm.  Right ovary showing a functional ovarian cyst.  Relatively small intramural uterine fibroids present.  Reassured and precautions associated with future abnormal vaginal bleeding discussed.  2. Pap smear abnormality of cervix with LGSIL Low-grade SIL with HPV 16 present.  Schedule colposcopy.  Counseling on above issues and coordination of care more than 50% for 15 minutes.  [name] MD, 3:51 PM  03/22/2018

## 2018-03-22 NOTE — Patient Instructions (Signed)
1. Perimenopause Oligomenorrhea with intermittent hot flashes and night sweats.  Recent FSH was not in the menopausal range at 12.6.  Pelvic ultrasound findings reviewed with patient.  Reassured that her endometrial lining was normal at 5.5 mm.  Right ovary showing a functional ovarian cyst.  Relatively small intramural uterine fibroids present.  Reassured and precautions associated with future abnormal vaginal bleeding discussed.  2. Pap smear abnormality of cervix with LGSIL Low-grade SIL with HPV 16 present.  Schedule colposcopy.  Saysha, it was a pleasure seeing you today!

## 2018-10-10 ENCOUNTER — Encounter: Payer: Self-pay | Admitting: Gynecology

## 2019-02-19 ENCOUNTER — Ambulatory Visit: Payer: No Typology Code available for payment source | Attending: Internal Medicine

## 2019-02-19 DIAGNOSIS — Z20822 Contact with and (suspected) exposure to covid-19: Secondary | ICD-10-CM

## 2019-02-20 LAB — NOVEL CORONAVIRUS, NAA: SARS-CoV-2, NAA: NOT DETECTED

## 2019-03-12 ENCOUNTER — Telehealth: Payer: Self-pay

## 2019-03-12 NOTE — Patient Instructions (Signed)
Thank you for choosing Primary Care at [facility] to be your medical home!    Shereece Mina Marble was seen by Melina Schools, DO today.   Newt Minion Soldo's primary care provider is Phill Myron, DO.   For the best care possible, you should try to see Phill Myron, DO whenever you come to the clinic.   We look forward to seeing you again soon!  If you have any questions about your visit today, please call us at [phone] or feel free to reach your primary care provider via [place].

## 2019-03-12 NOTE — Telephone Encounter (Signed)
Called patient to do their pre-visit COVID screening.  Call went to voicemail. Unable to do prescreening.  

## 2019-03-13 ENCOUNTER — Ambulatory Visit (INDEPENDENT_AMBULATORY_CARE_PROVIDER_SITE_OTHER): Payer: No Typology Code available for payment source

## 2019-03-13 ENCOUNTER — Other Ambulatory Visit: Payer: Self-pay

## 2019-03-13 ENCOUNTER — Ambulatory Visit (INDEPENDENT_AMBULATORY_CARE_PROVIDER_SITE_OTHER): Payer: No Typology Code available for payment source | Admitting: Internal Medicine

## 2019-03-13 ENCOUNTER — Encounter: Payer: Self-pay | Admitting: Internal Medicine

## 2019-03-13 VITALS — BP 124/79 | HR 84 | Temp 97.3°F | Resp 17 | Ht 61.0 in | Wt 121.0 lb

## 2019-03-13 DIAGNOSIS — M25562 Pain in left knee: Secondary | ICD-10-CM

## 2019-03-13 DIAGNOSIS — Z0000 Encounter for general adult medical examination without abnormal findings: Secondary | ICD-10-CM | POA: Diagnosis not present

## 2019-03-13 DIAGNOSIS — E785 Hyperlipidemia, unspecified: Secondary | ICD-10-CM | POA: Insufficient documentation

## 2019-03-13 DIAGNOSIS — M25473 Effusion, unspecified ankle: Secondary | ICD-10-CM

## 2019-03-13 DIAGNOSIS — Z1211 Encounter for screening for malignant neoplasm of colon: Secondary | ICD-10-CM

## 2019-03-13 DIAGNOSIS — Z1231 Encounter for screening mammogram for malignant neoplasm of breast: Secondary | ICD-10-CM

## 2019-03-13 DIAGNOSIS — Z114 Encounter for screening for human immunodeficiency virus [HIV]: Secondary | ICD-10-CM

## 2019-03-13 DIAGNOSIS — Z3202 Encounter for pregnancy test, result negative: Secondary | ICD-10-CM

## 2019-03-13 LAB — POCT URINE PREGNANCY: Preg Test, Ur: NEGATIVE

## 2019-03-13 NOTE — Addendum Note (Signed)
Addended by: Carylon Perches on: 03/13/2019 11:59 AM   Modules accepted: Orders

## 2019-03-13 NOTE — Progress Notes (Signed)
Subjective:    Rebecca Mcdowell - 54 y.o. female MRN [id number]  Date of birth: May 12, 1965  HPI  Rebecca Mcdowell is here for annual exam. Reports she is due for an exam with her GYN physician.   She is concerned about some swelling. This occurs in her ankles and feet. Notices it more when she is on her feet for longer periods of time or when she is flying. She reports she does like salty foods but doesn't feel like she's over the top with consumption. Occasionally happens in her hands in the mornings. No known history of CHF, kidney, or liver disease.   Left knee pain present since she fell on both knees while walking quickly two weeks ago. Left knee remains bruised and swollen. No limited ROM. Painful when she kneels.       Health Maintenance Due  Topic Date Due  . HIV Screening  06/10/1982  . TETANUS/TDAP  06/10/1986  . COLONOSCOPY  06/09/2017    -  reports that she has quit smoking. Her smoking use included cigarettes. She quit after 15.00 years of use. She has never used smokeless tobacco. - Review of Systems: Per HPI. - Past Medical History: Patient Active Problem List   Diagnosis Date Noted  . Generalized anxiety disorder 12/28/2017   - Medications: reviewed and updated   Objective:   Physical Exam BP 124/79   Pulse 84   Temp (!) 97.3 F (36.3 C) (Temporal)   Resp 17   Ht 5\' 1"  (1.549 m)   Wt 121 lb (54.9 kg)   LMP 12/31/2017 Comment: spotting  SpO2 98%   BMI 22.86 kg/m  Physical Exam  Constitutional: She is oriented to person, place, and time and well-developed, well-nourished, and in no distress.  HENT:  Head: Normocephalic and atraumatic.  Mouth/Throat: Oropharynx is clear and moist.  Eyes: Pupils are equal, round, and reactive to light. Conjunctivae and EOM are normal.  Neck: No thyromegaly present.  Cardiovascular: Normal rate, regular rhythm, normal heart sounds and intact distal pulses.  No murmur heard. No LE edema.   Pulmonary/Chest:  Effort normal and breath sounds normal. No respiratory distress. She has no wheezes.  No crackles present.   Abdominal: Soft. Bowel sounds are normal. She exhibits no distension. There is no abdominal tenderness. There is no rebound and no guarding.  Musculoskeletal:        General: No deformity or edema. Normal range of motion.     Cervical back: Normal range of motion and neck supple.     Comments: Left knee with a yellow bruise over patella and some trace edema. Good ROM without crepitus. Negative anterior drawer test. No laxity of joint with valgus or varus stress testing.   Lymphadenopathy:    She has no cervical adenopathy.  Neurological: She is alert and oriented to person, place, and time. Gait normal.  Skin: Skin is warm and dry. No rash noted. She is not diaphoretic.  Psychiatric: Mood, affect and judgment normal.           Assessment & Plan:   1. Encounter for annual physical exam  2. Ankle swelling, unspecified laterality Currently has no ankle swelling. Sounds most consistent with dependent edema that may be exacerbated by Na intake. Lungs are clear and edema is not present currently, lower suspicion for CHF. Patient is not taking any medications so not related to medication reaction. Will obtain some screening blood work to ensure no other potential etiologies.  - Comprehensive metabolic  panel - TSH - Urinalysis, Routine w reflex microscopic - CBC  3. Encounter for screening mammogram for malignant neoplasm of breast - MM DIGITAL SCREENING BILATERAL; Future  4. Acute pain of left knee Suspect lingering bone bruise from trauma. Will obtain x-ray to rule out fracture or bony abnormality. No exam findings that would be concerning for ligament or meniscal damage.  - DG Knee Complete 4 Views Left; Future  5. Screening for HIV (human immunodeficiency virus) - HIV Antibody (routine testing w rflx)  6. Hyperlipidemia, unspecified hyperlipidemia type Lipid panel from Dec  2019 with elevated cholesterol at 293 and elevated LDL at 212. Patient was prescribed a statin but does not take it.  - Lipid panel  7. Colon cancer screening Discussed with patient modalities of colon cancer screening. Elected for [employer].  - Cologuard; Future    Phill Myron, D.O. 03/13/2019, 11:08 AM Primary Care at [facility]

## 2019-03-14 LAB — COMPREHENSIVE METABOLIC PANEL
ALT: 48 IU/L — ABNORMAL HIGH (ref 0–32)
AST: 33 IU/L (ref 0–40)
Albumin/Globulin Ratio: 1.6 (ref 1.2–2.2)
Albumin: 4.4 g/dL (ref 3.8–4.9)
Alkaline Phosphatase: 69 IU/L (ref 39–117)
BUN/Creatinine Ratio: 23 (ref 9–23)
BUN: 16 mg/dL (ref 6–24)
Bilirubin Total: 0.3 mg/dL (ref 0.0–1.2)
CO2: 23 mmol/L (ref 20–29)
Calcium: 9.4 mg/dL (ref 8.7–10.2)
Chloride: 103 mmol/L (ref 96–106)
Creatinine, Ser: 0.71 mg/dL (ref 0.57–1.00)
GFR calc Af Amer: 114 mL/min/{1.73_m2} (ref >59)
GFR calc non Af Amer: 99 mL/min/{1.73_m2} (ref >59)
Globulin, Total: 2.8 g/dL (ref 1.5–4.5)
Glucose: 83 mg/dL (ref 65–99)
Potassium: 4 mmol/L (ref 3.5–5.2)
Sodium: 139 mmol/L (ref 134–144)
Total Protein: 7.2 g/dL (ref 6.0–8.5)

## 2019-03-14 LAB — URINALYSIS, ROUTINE W REFLEX MICROSCOPIC
Bilirubin, UA: NEGATIVE
Glucose, UA: NEGATIVE
Ketones, UA: NEGATIVE
Nitrite, UA: NEGATIVE
Protein,UA: NEGATIVE
Specific Gravity, UA: >=1.03 — AB (ref 1.005–1.030)
Urobilinogen, Ur: 0.2 mg/dL (ref 0.2–1.0)
pH, UA: 5 (ref 5.0–7.5)

## 2019-03-14 LAB — LIPID PANEL
Chol/HDL Ratio: 4.8 ratio — ABNORMAL HIGH (ref 0.0–4.4)
Cholesterol, Total: 300 mg/dL — ABNORMAL HIGH (ref 100–199)
HDL: 63 mg/dL (ref >39)
LDL Chol Calc (NIH): 206 mg/dL — ABNORMAL HIGH (ref 0–99)
Triglycerides: 167 mg/dL — ABNORMAL HIGH (ref 0–149)
VLDL Cholesterol Cal: 31 mg/dL (ref 5–40)

## 2019-03-14 LAB — CBC
Hematocrit: 43.8 % (ref 34.0–46.6)
Hemoglobin: 14 g/dL (ref 11.1–15.9)
MCH: 28.8 pg (ref 26.6–33.0)
MCHC: 32 g/dL (ref 31.5–35.7)
MCV: 90 fL (ref 79–97)
Platelets: 271 10*3/uL (ref 150–450)
RBC: 4.86 x10E6/uL (ref 3.77–5.28)
RDW: 13.5 % (ref 11.7–15.4)
WBC: 5.2 10*3/uL (ref 3.4–10.8)

## 2019-03-14 LAB — MICROSCOPIC EXAMINATION
Casts: NONE SEEN /lpf
Epithelial Cells (non renal): >10 /hpf — AB (ref 0–10)

## 2019-03-14 LAB — TSH: TSH: 1.87 u[IU]/mL (ref 0.450–4.500)

## 2019-03-14 LAB — HIV ANTIBODY (ROUTINE TESTING W REFLEX): HIV Screen 4th Generation wRfx: NONREACTIVE

## 2019-03-15 NOTE — Progress Notes (Signed)
Patient notified of results & recommendations. Expressed understanding.

## 2019-04-01 ENCOUNTER — Telehealth: Payer: Self-pay

## 2019-04-01 NOTE — Telephone Encounter (Signed)
Please advise on the MyChart messages below. Notes copied & pasted from messages patient sent in.  Patient does have MyChart access & is expecting a reply back there.

## 2019-04-01 NOTE — Telephone Encounter (Signed)
Dear Dr. Juleen China, I hope this finds you well. I' ve been meaning to follow -up with you since the lab results were posted from my last appointment on Feb. 24. Since then, I also received my second Shingles shot last Tuesday, March 9th, and would like to report what transpired from that as well.  First, the lab results- I will list then add a question or comment: -There was a comment about blood in urine and cloudy;  should I have concerns about that? -A comment about Cold Agglutinins; should that be discussed further? -Knee x-ray: a statement was made that I may have fluid or Baker's cyst? How concerned do I need to be about this? -Side effects from 2nd shingles shot-  It started out pretty typical based on literature.  Within 24 hours after receiving the shot, I started vomiting. My head and whole body were aching severely ( I guess that's normal though). My temp. was not very elevated- only 99.8.  I ended up vomiting five times however, until Thursday afternoon.  Cont. w/ new msg  Cont. from previous message; Is it normal to vomit profusely from a 2nd shingles shot, because from what I read, it's not. Anyway, I'm reporting to you first to let you know. I didn't experience any major swelling in my arm and no anaphylaxis thank goodness.  Please let me know of concerns of any of the following which I listed.  Thank you for your time and have a good week.  Rebecca Mcdowell

## 2019-04-02 NOTE — Telephone Encounter (Signed)
Patient notified

## 2019-04-16 ENCOUNTER — Other Ambulatory Visit: Payer: Self-pay

## 2019-04-16 ENCOUNTER — Ambulatory Visit
Admission: RE | Admit: 2019-04-16 | Discharge: 2019-04-16 | Disposition: A | Discharge status: Home / Self Care | Payer: No Typology Code available for payment source | Source: Ambulatory Visit | Attending: Internal Medicine | Admitting: Internal Medicine

## 2019-04-16 DIAGNOSIS — Z1231 Encounter for screening mammogram for malignant neoplasm of breast: Secondary | ICD-10-CM

## 2019-04-24 ENCOUNTER — Ambulatory Visit: Payer: No Typology Code available for payment source | Admitting: Internal Medicine

## 2019-05-02 ENCOUNTER — Encounter: Payer: Self-pay | Admitting: Internal Medicine

## 2019-05-02 ENCOUNTER — Telehealth (INDEPENDENT_AMBULATORY_CARE_PROVIDER_SITE_OTHER): Payer: No Typology Code available for payment source | Admitting: Internal Medicine

## 2019-05-02 DIAGNOSIS — M25562 Pain in left knee: Secondary | ICD-10-CM | POA: Diagnosis not present

## 2019-05-02 DIAGNOSIS — R3121 Asymptomatic microscopic hematuria: Secondary | ICD-10-CM | POA: Diagnosis not present

## 2019-05-02 DIAGNOSIS — R718 Other abnormality of red blood cells: Secondary | ICD-10-CM

## 2019-05-02 NOTE — Progress Notes (Signed)
Virtual Visit via Telephone Note  I connected with Rebecca Mcdowell, on 05/02/2019 at 3:56 PM by telephone due to the COVID-19 pandemic and verified that I am speaking with the correct person using two identifiers.   Consent: I discussed the limitations, risks, security and privacy concerns of performing an evaluation and management service by telephone and the availability of in person appointments. I also discussed with the patient that there may be a patient responsible charge related to this service. The patient expressed understanding and agreed to proceed.   Location of Patient: Home   Location of Provider: Clinic    Persons participating in Telemedicine visit: Laporsche Naomie Boileau [facility] Dr. Juleen China    History of Present Illness: Patient has visit to follow up on multiple acute concerns.   Discussed presence of RBCs in urine sample collected in Feb. She does not have any dysuria, urinary frequency, or urgency. No gross hematuria.   Left knee pain seems to be improving. She still notes some swelling behind her knee but pain seems to be more manageable.    Past Medical History:  Diagnosis Date  . Abnormal Pap- s/p LEEP   . Decreased hearing   . Hyperlipidemia    No Known Allergies  No current outpatient medications on file prior to visit.   No current facility-administered medications on file prior to visit.    Observations/Objective: NAD. Speaking clearly.  Work of breathing normal.  Alert and oriented. Mood appropriate.   Assessment and Plan: 1. Asymptomatic microscopic hematuria Given 3-10 RBCs in asymptomatic patient will repeat UA. If still elevated, will need urology referral especially given 15 year smoking history.  - Urinalysis, Routine w reflex microscopic; Future  2. 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 Otherwise, CBC was unremarkable  without anemia. Patient reports occasionally feeling more cold than others but doesn't believe she has extreme sensitivity to cold. Will work up for cold agglutinin disease.  - CBC; Future - Pathologist smear review; Future - Haptoglobin; Future - Lactate dehydrogenase; Future - Bilirubin,Direct/Indirect(Fractionated); Future - Direct antiglobulin test; Future  3. Acute pain of left knee Improving. Will monitor.     Follow Up Instructions: Lab visit    I discussed the assessment and treatment plan with the patient. The patient was provided an opportunity to ask questions and all were answered. The patient agreed with the plan and demonstrated an understanding of the instructions.   The patient was advised to call back or seek an in-person evaluation if the symptoms worsen or if the condition fails to improve as anticipated.     I provided 38 minutes total of non-face-to-face time during this encounter including median intraservice time, reviewing previous notes, investigations, ordering medications, medical decision making, coordinating care and patient verbalized understanding at the end of the visit.    Phill Myron, D.O. Primary [facility] at [facility]  05/02/2019, 3:56 PM

## 2019-05-08 ENCOUNTER — Other Ambulatory Visit: Payer: Self-pay

## 2019-05-09 ENCOUNTER — Ambulatory Visit (INDEPENDENT_AMBULATORY_CARE_PROVIDER_SITE_OTHER): Payer: No Typology Code available for payment source | Admitting: Obstetrics & Gynecology

## 2019-05-09 ENCOUNTER — Encounter: Payer: Self-pay | Admitting: Obstetrics & Gynecology

## 2019-05-09 VITALS — BP 124/82 | Ht 62.5 in | Wt 121.0 lb

## 2019-05-09 DIAGNOSIS — N841 Polyp of cervix uteri: Secondary | ICD-10-CM | POA: Diagnosis not present

## 2019-05-09 DIAGNOSIS — R87612 Low grade squamous intraepithelial lesion on cytologic smear of cervix (LGSIL): Secondary | ICD-10-CM

## 2019-05-09 DIAGNOSIS — N914 Secondary oligomenorrhea: Secondary | ICD-10-CM | POA: Diagnosis not present

## 2019-05-09 DIAGNOSIS — B977 Papillomavirus as the cause of diseases classified elsewhere: Secondary | ICD-10-CM

## 2019-05-09 NOTE — Progress Notes (Signed)
Rebecca Mcdowell [birth year] [id number]   History:    54 y.o. G0 Married.  Moved from [place] 11/2017.  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  No pelvic pain. History of positive high-risk HPV with abnormal Pap tests and and cervical dysplasia.  Probably had a LEEP in 91.  Per patient, she was offered a hysterectomy a few times by her gynecologist in [place].  Last Pap test 02/2018 LGSIL with HPV 16 positive, Colposcopy recommended, but patient didn't follow up until today.  Past medical history,surgical history, family history and social history were all reviewed and documented in the EPIC chart.  Gynecologic History Patient's last menstrual period was 04/21/2019.  Obstetric History OB History  Gravida Para Term Preterm AB Living  0 0 0 0 0 0  SAB TAB Ectopic Multiple Live Births  0 0 0 0 0     ROS: A ROS was performed and pertinent positives and negatives are included in the history.  GENERAL: No fevers or chills. HEENT: No change in vision, no earache, sore throat or sinus congestion. NECK: No pain or stiffness. CARDIOVASCULAR: No chest pain or pressure. No palpitations. PULMONARY: No shortness of breath, cough or wheeze. GASTROINTESTINAL: No abdominal pain, nausea, vomiting or diarrhea, melena or bright red blood per rectum. GENITOURINARY: No urinary frequency, urgency, hesitancy or dysuria. MUSCULOSKELETAL: No joint or muscle pain, no back pain, no recent trauma. DERMATOLOGIC: No rash, no itching, no lesions. ENDOCRINE: No polyuria, polydipsia, no heat or cold intolerance. No recent change in weight. HEMATOLOGICAL: No anemia or easy bruising or bleeding. NEUROLOGIC: No headache, seizures, numbness, tingling or weakness. PSYCHIATRIC: No depression, no loss of interest in normal activity or change in sleep pattern.     Exam:   BP 124/82   Ht 5' 2.5" (1.588 m)   Wt 121 lb (54.9 kg)   LMP 04/21/2019  Comment: spotting  BMI 21.78 kg/m   Body mass index is 21.78 kg/m.  General appearance : Well developed well nourished female. No acute distress  Colposcopy Procedure Note Rebecca Mcdowell 05/09/2019  Indications:  LGSIL/HPV 16 positive  Procedure Details  The risks and benefits of the procedure and Verbal informed consent obtained.  Speculum placed in vagina and excellent visualization of cervix achieved, cervix swabbed x 3 with acetic acid solution.  Findings:  Cervix colposcopy: Physical Exam Genitourinary:       Vaginal colposcopy: Normal  Vulvar colposcopy: Normal  Perirectal colposcopy: Normal  The cervix was sprayed with Hurricane before performing the cervical biopsies.  Specimens: Pap/HPV HR.  Cervical Bx at 2 and 9 O'Clock.  Endocervical Polyp excised.  Complications:  None, good hemostasis with Silver Nitrate. . Plan:  Management per Patho of Cervical Bx results   Assessment/Plan:  54 y.o. female for annual exam   1. Pap smear abnormality of cervix with LGSIL Colposcopy procedure discussed with patient.  Colposcopy findings reviewed.  Well tolerated without complication.  Management per cervical Biopsy results. - Pathology Report ([facility]) - PAP,TP IMGw/HPV RNA,rflx [id number]  2. 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 Cervical polyp Excision of Cervical Polyp.  Sent to pathology.  Hemostasis with Silver Nitrate. - Pathology Report ([facility])  4. Secondary oligomenorrhea Oligomenorrhea associated with Perimenopause or PMB.  Will further investigate at f/u with a pelvic US. - US Transvaginal Non-OB; Future  Rebecca Dellis Filbert  MD, 2:43 PM 05/09/2019

## 2019-05-10 ENCOUNTER — Encounter: Payer: Self-pay | Admitting: Obstetrics & Gynecology

## 2019-05-10 NOTE — Patient Instructions (Signed)
1. Pap smear abnormality of cervix with LGSIL Colposcopy procedure discussed with patient.  Colposcopy findings reviewed.  Well tolerated without complication.  Management per cervical Biopsy results. - Pathology Report ([facility]) - PAP,TP IMGw/HPV RNA,rflx [id number]  2. 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 Cervical polyp Excision of Cervical Polyp.  Sent to pathology.  Hemostasis with Silver Nitrate. - Pathology Report ([facility])  4. Secondary oligomenorrhea Oligomenorrhea associated with Perimenopause or PMB.  Will further investigate at f/u with a pelvic US. - US Transvaginal Non-OB; Future  Piedad, it was a pleasure seeing you today!  I will inform you of your results as soon as they are available.

## 2019-05-15 LAB — PAP, TP IMAGING W/ HPV RNA, RFLX HPV TYPE 16,18/45: HPV DNA High Risk: DETECTED — AB

## 2019-05-15 LAB — TISSUE PATH REPORT 10802

## 2019-05-15 LAB — PATHOLOGY REPORT

## 2019-05-20 ENCOUNTER — Encounter: Payer: Self-pay | Admitting: *Deleted

## 2019-05-23 ENCOUNTER — Other Ambulatory Visit: Payer: Self-pay

## 2019-05-23 ENCOUNTER — Ambulatory Visit (INDEPENDENT_AMBULATORY_CARE_PROVIDER_SITE_OTHER): Payer: No Typology Code available for payment source | Admitting: Obstetrics & Gynecology

## 2019-05-23 ENCOUNTER — Ambulatory Visit (INDEPENDENT_AMBULATORY_CARE_PROVIDER_SITE_OTHER): Payer: No Typology Code available for payment source

## 2019-05-23 ENCOUNTER — Encounter: Payer: Self-pay | Admitting: Obstetrics & Gynecology

## 2019-05-23 DIAGNOSIS — D251 Intramural leiomyoma of uterus: Secondary | ICD-10-CM

## 2019-05-23 DIAGNOSIS — N72 Inflammatory disease of cervix uteri: Secondary | ICD-10-CM

## 2019-05-23 DIAGNOSIS — N914 Secondary oligomenorrhea: Secondary | ICD-10-CM

## 2019-05-23 DIAGNOSIS — B977 Papillomavirus as the cause of diseases classified elsewhere: Secondary | ICD-10-CM

## 2019-05-23 DIAGNOSIS — N951 Menopausal and female climacteric states: Secondary | ICD-10-CM

## 2019-05-23 NOTE — Progress Notes (Signed)
    Rebecca Mcdowell 09/05/1965 [id number]        54 y.o.  G0  RP: Oligomenorrhea for Pelvic US  HPI: Irregular menses with oligomenorrhea.  No pelvic pain.   OB History  Gravida Para Term Preterm AB Living  0 0 0 0 0 0  SAB TAB Ectopic Multiple Live Births  0 0 0 0 0    Past medical history,surgical history, problem list, medications, allergies, family history and social history were all reviewed and documented in the EPIC chart.   Directed ROS with pertinent positives and negatives documented in the history of present illness/assessment and plan.  Exam:  There were no vitals filed for this visit. General appearance:  Normal  Pelvic US today: Comparison is made with previous scan on March 22, 2018.  Nonlatex probe cover used.  T/V images.  Anteverted uterus normal in size and shape with several intramural fibroids, no significant change in size since previous scan.  5 fibroids are measured in the 1 to 2 cm range in diameter.  The uterus is measured at 7.19 x 5.62 x 4.91 cm.  The endometrial lining is symmetrical measured at 5.03 mm with no thickening or mass or abnormal blood flow seen.  The right ovary is small with atrophic appearance, the cyst previously seen is no longer present.  The left ovary is surgically absent.  No adnexal mass.  No free fluid in the posterior cul-de-sac.   Assessment/Plan:  54 y.o. G0  1. Secondary oligomenorrhea Pelvic US reviewed thoroughly with patient, the uterus presents intramural fibroids which are stable in size since the last ultrasound.  The overall size of the uterus is within normal limits.  The endometrial lining is symmetrical measured at 5.03 mm with no thickening or mass or abnormal blood flow seen.  Patient reassured.  Ovaries were normal with no free fluid in the pelvis.  2. Perimenopausal Decision to observe.    3. High risk human papilloma virus (HPV) infection of cervix ASCUS/HPV HR positive/H/O HPV 16 positive.  Colposcopy  05/09/2019 Koilocytotic atypia, not reaching Dysplasia.  Repeat Pap in 6 months.  [name] MD, 4:40 PM 05/23/2019

## 2019-05-28 ENCOUNTER — Encounter: Payer: Self-pay | Admitting: Obstetrics & Gynecology

## 2019-05-28 NOTE — Patient Instructions (Signed)
1. Secondary oligomenorrhea Pelvic US reviewed thoroughly with patient, the uterus presents intramural fibroids which are stable in size since the last ultrasound.  The overall size of the uterus is within normal limits.  The endometrial lining is symmetrical measured at 5.03 mm with no thickening or mass or abnormal blood flow seen.  Patient reassured.  Ovaries were normal with no free fluid in the pelvis.  2. Perimenopausal Decision to observe.    3. High risk human papilloma virus (HPV) infection of cervix ASCUS/HPV HR positive/H/O HPV 16 positive.  Colposcopy 05/09/2019 Koilocytotic atypia, not reaching Dysplasia.  Repeat Pap in 6 months.  Rebecca Mcdowell, it was a pleasure seeing you today!

## 2019-07-05 ENCOUNTER — Telehealth: Payer: Self-pay | Admitting: Internal Medicine

## 2019-07-05 NOTE — Telephone Encounter (Signed)
Pt called the office stated she was being code as a OBGYN visits. Wants to speak with management.

## 2019-11-22 ENCOUNTER — Encounter: Payer: Self-pay | Admitting: Obstetrics & Gynecology

## 2019-11-22 ENCOUNTER — Other Ambulatory Visit: Payer: Self-pay

## 2019-11-22 ENCOUNTER — Ambulatory Visit: Payer: No Typology Code available for payment source | Admitting: Obstetrics & Gynecology

## 2019-11-22 VITALS — BP 120/80

## 2019-11-22 DIAGNOSIS — R87610 Atypical squamous cells of undetermined significance on cytologic smear of cervix (ASC-US): Secondary | ICD-10-CM | POA: Diagnosis not present

## 2019-11-22 DIAGNOSIS — R87810 Cervical high risk human papillomavirus (HPV) DNA test positive: Secondary | ICD-10-CM

## 2019-11-22 DIAGNOSIS — N951 Menopausal and female climacteric states: Secondary | ICD-10-CM | POA: Diagnosis not present

## 2019-11-22 NOTE — Progress Notes (Signed)
    Rebecca Mcdowell October 15, 1965 [id number]        54 y.o.  G0P0000   RP: Repeat Pap at 6 months  HPI: H/O ASCUS/HPV HR pos 04/2019, h/o HPV 16 pos in 2020.  Last Colpo 04/2019 Koilocytotic Atypia not reaching dysplasia.  Occasional hot flushes.  Oligomenorrhea, LMP 04/2019.  Abstinent.   OB History  Gravida Para Term Preterm AB Living  0 0 0 0 0 0  SAB TAB Ectopic Multiple Live Births  0 0 0 0 0    Past medical history,surgical history, problem list, medications, allergies, family history and social history were all reviewed and documented in the EPIC chart.   Directed ROS with pertinent positives and negatives documented in the history of present illness/assessment and plan.  Exam:  Vitals:   11/22/19 0954  BP: 120/80   General appearance:  Normal  Gynecologic exam: Vulva normal.  Speculum: Cervix normal.  Pap test with high-risk HPV done.  Vagina normal.  No blood and no abnormal discharge.   Assessment/Plan:  54 y.o. G0P0000   1. ASCUS with positive high risk HPV cervical ASCUS with positive high risk HPV in April 2021.  History of HPV 16+ in 2020.  Last colposcopy April 2021 showed koilocytotic atypia not reaching dysplasia.  2. Perimenopausal Oligomenorrhea with no menstrual period since April 2021.  Occasional hot flushes.  Counseling on perimenopause done.  Decision to observe.  Abnormal bleeding precautions reviewed.  Abstinent.  Other orders - Multiple Vitamin (MULTIVITAMIN) capsule; Take 1 capsule by mouth daily.  [name] MD, 10:57 AM 11/22/2019

## 2019-11-27 LAB — PAP IG W/ RFLX HPV ASCU

## 2019-11-27 LAB — HUMAN PAPILLOMAVIRUS, HIGH RISK: HPV DNA High Risk: DETECTED — AB

## 2019-12-08 IMAGING — MG DIGITAL DIAGNOSTIC UNILATERAL RIGHT MAMMOGRAM WITH TOMO AND CAD
6 series · 6 of 18 positions shown · non-contrast
Comparison: Previous exam(s).

CLINICAL DATA: Right breast asymmetry seen on most recent screening
mammography.

EXAM:
DIGITAL DIAGNOSTIC UNILATERAL RIGHT MAMMOGRAM WITH CAD AND TOMO

[R MLO synth-2D]
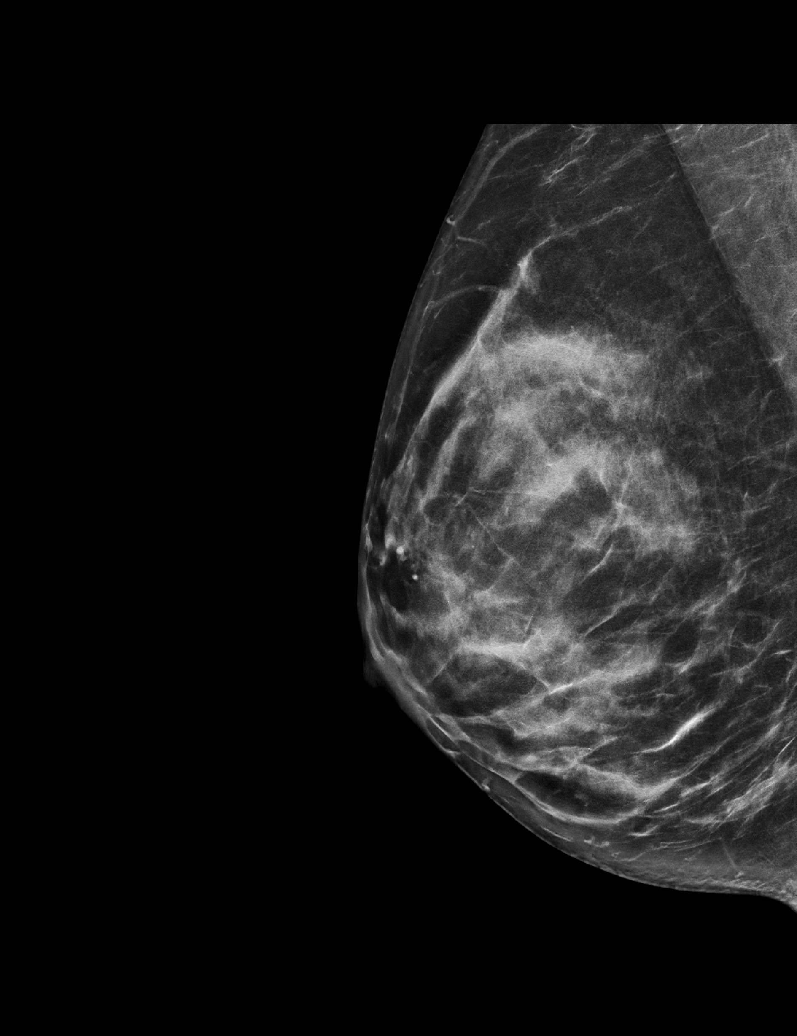

[R ML synth-2D]
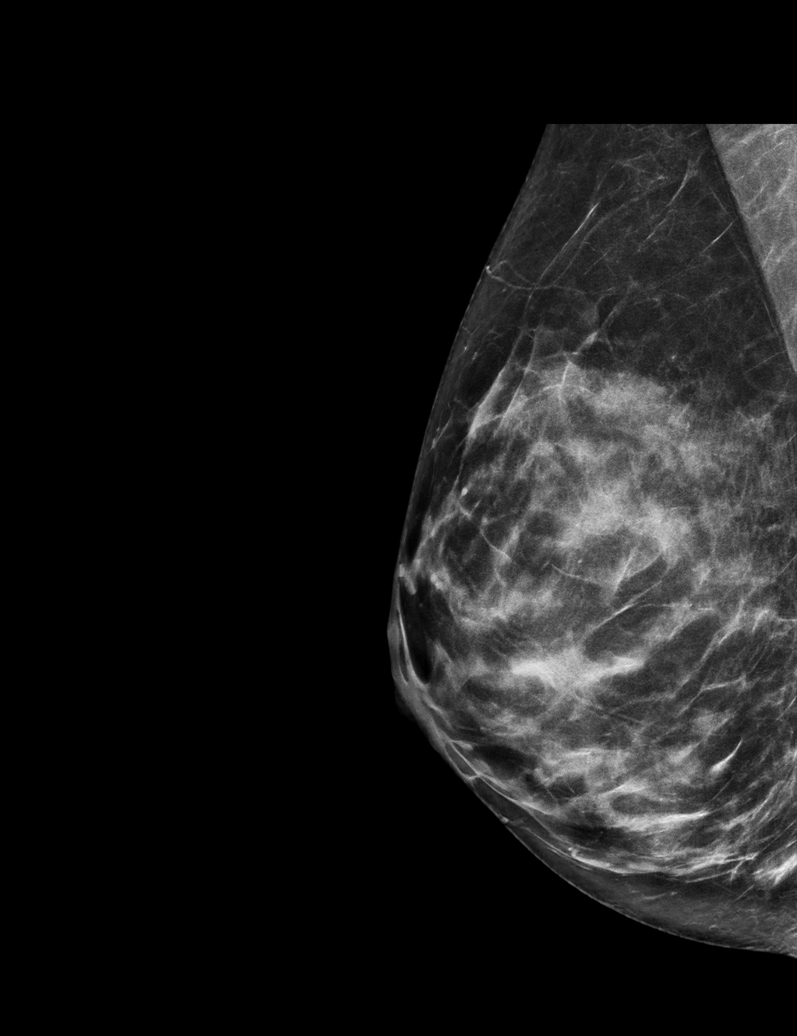

[R CC synth-2D]
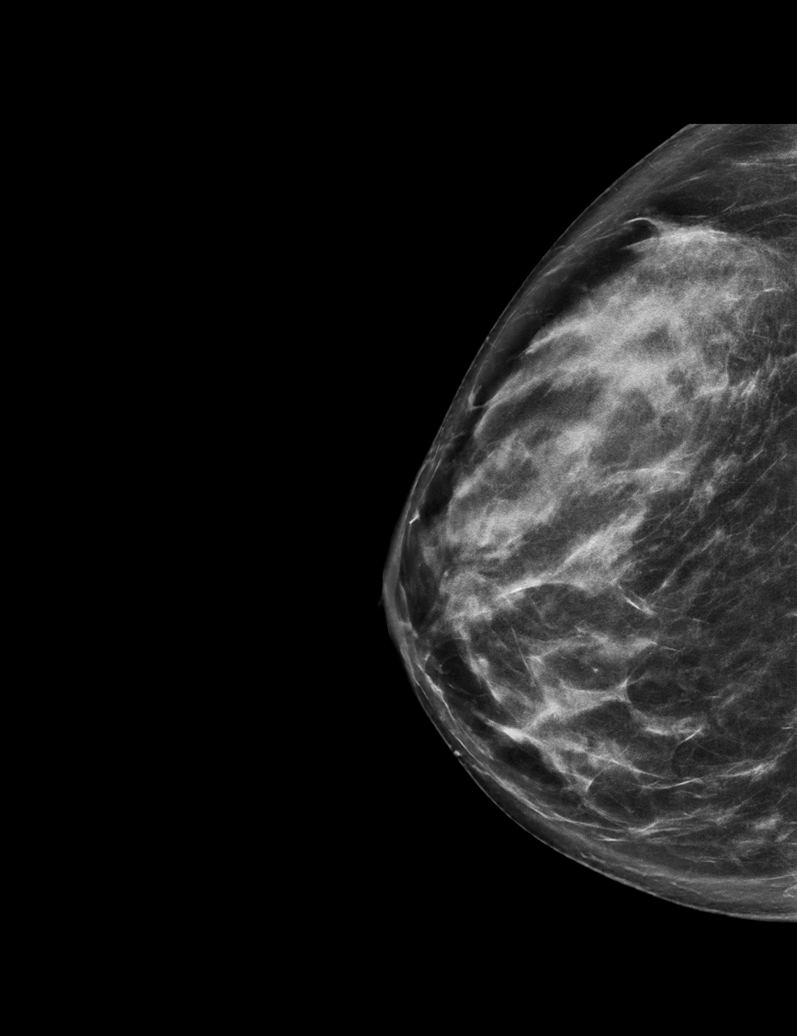

[R ML tomo · tomo slice 33/66.0]
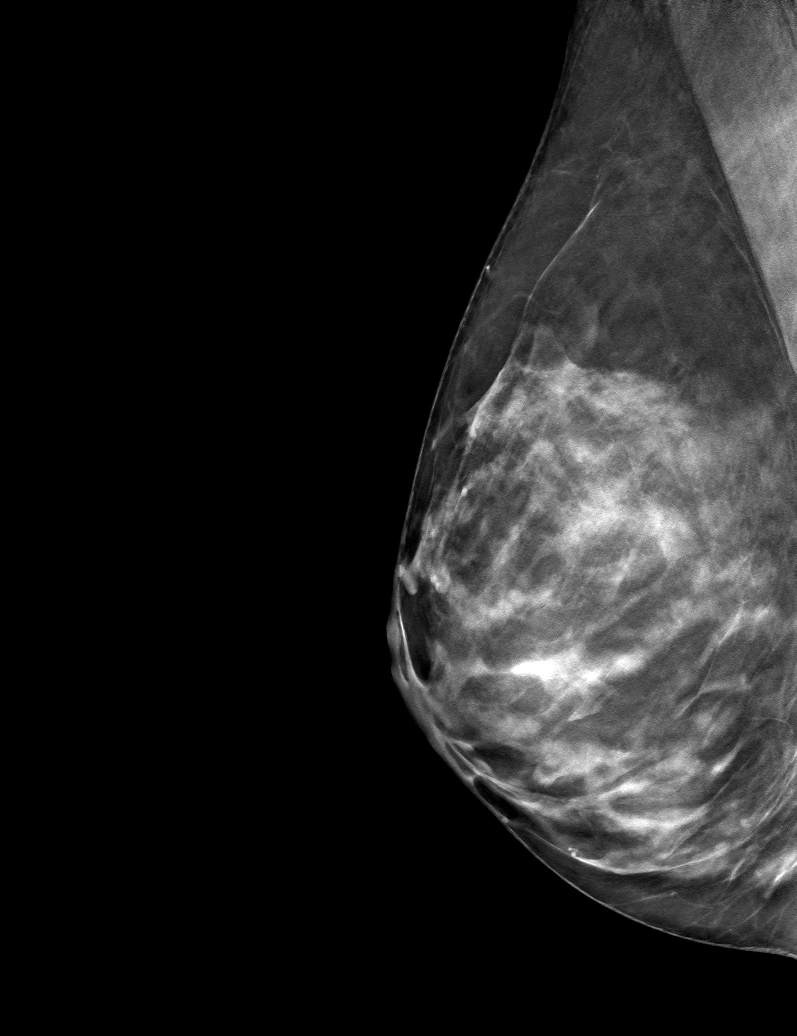

[R MLO tomo · tomo slice 35/68.0]
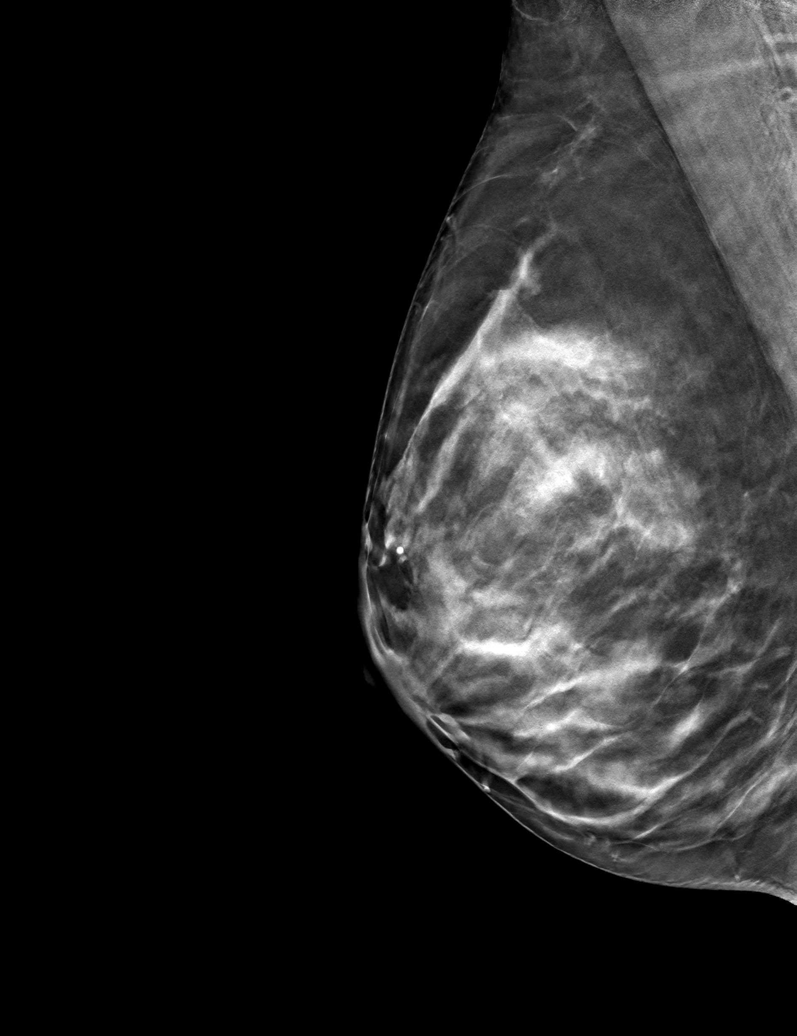

[R CC tomo · tomo slice 37/72.0]
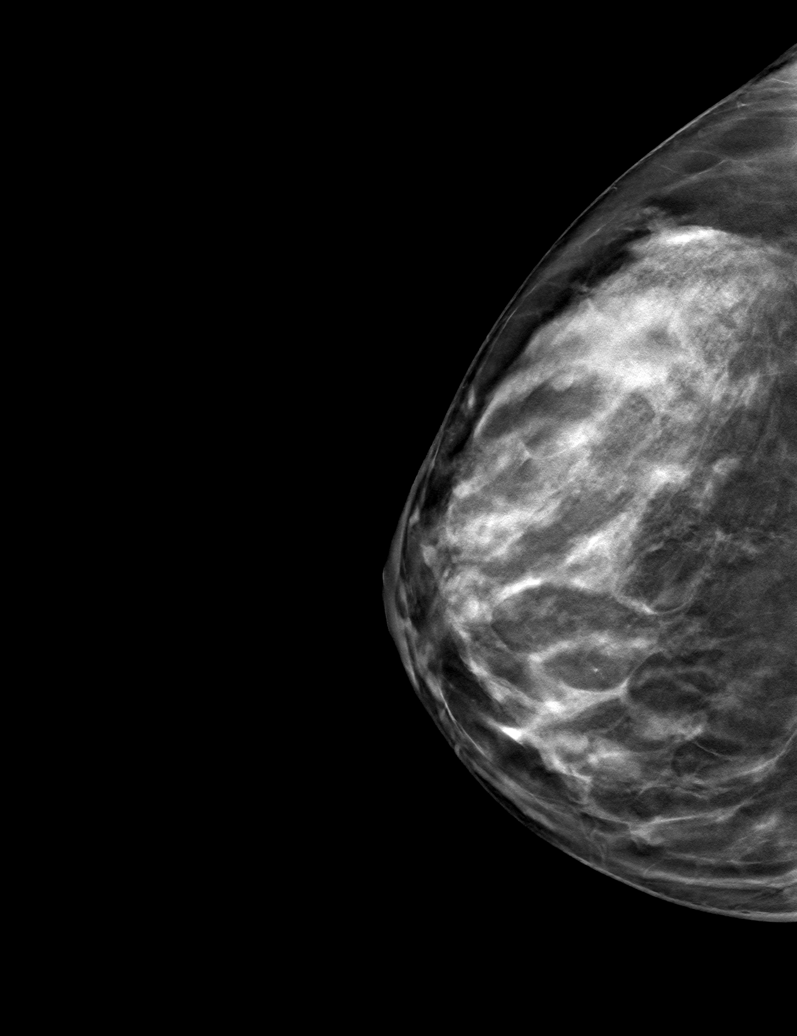

[6 of 18 positions shown; findings below may reference images not displayed]

ACR Breast Density Category c: The breast tissue is heterogeneously
dense, which may obscure small masses.
FINDINGS: Additional mammographic views of the right breast demonstrate no
suspicious masses areas of architectural distortion. The previously
questioned asymmetry in the lateral right breast, posterior depth,
effaces to glandular tissue.

Mammographic images were processed with CAD.
IMPRESSION: No mammographic evidence of malignancy in the right breast.

RECOMMENDATION:
Screening mammogram in one year.(Code:TU-B-AI0)

I have discussed the findings and recommendations with the patient.
Results were also provided in writing at the conclusion of the
visit. If applicable, a reminder letter will be sent to the patient
regarding the next appointment.

BI-RADS CATEGORY  1: Negative.

## 2020-01-27 ENCOUNTER — Other Ambulatory Visit: Payer: Self-pay | Admitting: *Deleted

## 2020-01-27 DIAGNOSIS — R87610 Atypical squamous cells of undetermined significance on cytologic smear of cervix (ASC-US): Secondary | ICD-10-CM

## 2020-01-27 DIAGNOSIS — R87810 Cervical high risk human papillomavirus (HPV) DNA test positive: Secondary | ICD-10-CM

## 2020-01-29 ENCOUNTER — Ambulatory Visit: Payer: No Typology Code available for payment source | Admitting: Obstetrics & Gynecology

## 2020-01-29 ENCOUNTER — Other Ambulatory Visit: Payer: Self-pay

## 2020-03-04 ENCOUNTER — Encounter: Payer: Self-pay | Admitting: Obstetrics & Gynecology

## 2020-03-04 ENCOUNTER — Ambulatory Visit (INDEPENDENT_AMBULATORY_CARE_PROVIDER_SITE_OTHER): Payer: No Typology Code available for payment source | Admitting: Obstetrics & Gynecology

## 2020-03-04 ENCOUNTER — Other Ambulatory Visit: Payer: Self-pay

## 2020-03-04 DIAGNOSIS — R87610 Atypical squamous cells of undetermined significance on cytologic smear of cervix (ASC-US): Secondary | ICD-10-CM | POA: Diagnosis not present

## 2020-03-04 DIAGNOSIS — R87810 Cervical high risk human papillomavirus (HPV) DNA test positive: Secondary | ICD-10-CM | POA: Diagnosis not present

## 2020-03-04 NOTE — Progress Notes (Signed)
    Rebecca Mcdowell 04/21/1965 [id number]        55 y.o.  G0  RP: ASCUS/HPV HR pos 11/2019 for Colposcopy  HPI: ASCUS/HPV HR pos 11/2019.  Had ASCUS/HPV HR pos in 04/2019, h/o HPV 16 pos in 2020.  Last Colpo 04/2019 Koilocytotic Atypia not reaching dysplasia.  Abstinent.   OB History  Gravida Para Term Preterm AB Living  0 0 0 0 0 0  SAB IAB Ectopic Multiple Live Births  0 0 0 0 0    Past medical history,surgical history, problem list, medications, allergies, family history and social history were all reviewed and documented in the EPIC chart.   Directed ROS with pertinent positives and negatives documented in the history of present illness/assessment and plan.  Exam:  There were no vitals filed for this visit. General appearance:  Normal  Colposcopy Procedure Note Rebecca Mcdowell 03/04/2020  Indications:  ASCUS/HPV HR pos for Colpo  Procedure Details  The risks and benefits of the procedure and Verbal informed consent obtained.  Speculum placed in vagina and excellent visualization of cervix achieved, cervix swabbed x 3 with acetic acid solution.  Findings:  Cervix colposcopy: Physical Exam Genitourinary:       Vaginal colposcopy: Normal  Vulvar colposcopy: Normal  Perirectal colposcopy: Normal  The cervix was sprayed with Hurricane before performing the cervical biopsies.  Specimens: Cervical Bxs at 6, 9, 12 O'Clock.  Complications: No complication, good hemostasis with Silver Nitrate. . Plan:  Management per results.  If severe dysplasia, would consider HPV vaccine together with the surgical treatment.   Assessment/Plan:  55 y.o. G0  1. ASCUS with positive high risk HPV cervical ASCUS with positive high-risk HPV November 2021.  History of positive HPV 16 in 2020.  Colposcopy with atypia not reaching dysplasia in April 2021.  Colposcopy done today with cervical biopsies.  Management per cervical biopsy results.  Postprocedure precautions  reviewed. - Colposcopy - Pathology Report ([facility])  Other orders - Pathology Report - TISSUE PATH REPORT [id number]  Rebecca Bruins MD, 3:20 PM 03/04/2020

## 2020-03-05 NOTE — Telephone Encounter (Signed)
I called patient and left message to call me so I can explain this to her.

## 2020-03-06 LAB — PATHOLOGY REPORT

## 2020-03-06 LAB — TISSUE PATH REPORT 10802

## 2020-03-09 NOTE — Telephone Encounter (Signed)
I called patient and got her voice mail. Left detailed message and explained that the $2500 OOP max and OOP max remaining is referring to her insurance company and how much she will pay OOP in a plan year.  Colpo estimate owed is correct. Call if any other questions.

## 2020-03-14 ENCOUNTER — Encounter: Payer: Self-pay | Admitting: Obstetrics & Gynecology

## 2020-05-11 ENCOUNTER — Encounter: Payer: No Typology Code available for payment source | Admitting: Obstetrics & Gynecology

## 2020-06-26 ENCOUNTER — Encounter: Payer: Self-pay | Admitting: Obstetrics & Gynecology

## 2020-06-26 ENCOUNTER — Ambulatory Visit (INDEPENDENT_AMBULATORY_CARE_PROVIDER_SITE_OTHER): Payer: No Typology Code available for payment source | Admitting: Obstetrics & Gynecology

## 2020-06-26 ENCOUNTER — Other Ambulatory Visit: Payer: Self-pay

## 2020-06-26 ENCOUNTER — Telehealth: Payer: Self-pay | Admitting: *Deleted

## 2020-06-26 ENCOUNTER — Other Ambulatory Visit (HOSPITAL_COMMUNITY)
Admission: RE | Admit: 2020-06-26 | Discharge: 2020-06-26 | Disposition: A | Discharge status: Home / Self Care | Payer: No Typology Code available for payment source | Source: Ambulatory Visit | Attending: Obstetrics & Gynecology | Admitting: Obstetrics & Gynecology

## 2020-06-26 VITALS — BP 124/80 | Ht 62.5 in | Wt 112.0 lb

## 2020-06-26 DIAGNOSIS — Z01419 Encounter for gynecological examination (general) (routine) without abnormal findings: Secondary | ICD-10-CM | POA: Insufficient documentation

## 2020-06-26 DIAGNOSIS — Z780 Asymptomatic menopausal state: Secondary | ICD-10-CM | POA: Diagnosis not present

## 2020-06-26 DIAGNOSIS — R87610 Atypical squamous cells of undetermined significance on cytologic smear of cervix (ASC-US): Secondary | ICD-10-CM | POA: Diagnosis present

## 2020-06-26 DIAGNOSIS — Z7189 Other specified counseling: Secondary | ICD-10-CM

## 2020-06-26 DIAGNOSIS — R87810 Cervical high risk human papillomavirus (HPV) DNA test positive: Secondary | ICD-10-CM | POA: Diagnosis present

## 2020-06-26 DIAGNOSIS — Z7185 Encounter for immunization safety counseling: Secondary | ICD-10-CM

## 2020-06-26 NOTE — Progress Notes (Signed)
Rebecca Mcdowell 1965-11-16 [id number]   History:    56 y.o. G0 Married.  Moved from [place] 11/2017.   RP:  Established patient presenting for annual gyn exam   HPI: Postmenopausal, well on no HRT.  No PMB.  No pelvic pain.  Abstinent for many years.  Had ASCUS/HPV HR pos in 04/2019, h/o HPV 16 pos in 2020. Colpo 02/2020 No dysplasia. Probably had a LEEP in 91.  Breast normal.  Health labs with family physician. Body mass index 20.16.  Healthy nutrition and good fitness.   Past medical history,surgical history, family history and social history were all reviewed and documented in the EPIC chart.  Gynecologic History Patient's last menstrual period was 04/21/2019.  Obstetric History OB History  Gravida Para Term Preterm AB Living  0 0 0 0 0 0  SAB IAB Ectopic Multiple Live Births  0 0 0 0 0     ROS: A ROS was performed and pertinent positives and negatives are included in the history.  GENERAL: No fevers or chills. HEENT: No change in vision, no earache, sore throat or sinus congestion. NECK: No pain or stiffness. CARDIOVASCULAR: No chest pain or pressure. No palpitations. PULMONARY: No shortness of breath, cough or wheeze. GASTROINTESTINAL: No abdominal pain, nausea, vomiting or diarrhea, melena or bright red blood per rectum. GENITOURINARY: No urinary frequency, urgency, hesitancy or dysuria. MUSCULOSKELETAL: No joint or muscle pain, no back pain, no recent trauma. DERMATOLOGIC: No rash, no itching, no lesions. ENDOCRINE: No polyuria, polydipsia, no heat or cold intolerance. No recent change in weight. HEMATOLOGICAL: No anemia or easy bruising or bleeding. NEUROLOGIC: No headache, seizures, numbness, tingling or weakness. PSYCHIATRIC: No depression, no loss of interest in normal activity or change in sleep pattern.     Exam:   BP 124/80   Ht 5' 2.5" (1.588 m)   Wt 112 lb (50.8 kg)   LMP 04/21/2019 Comment: spotting  BMI 20.16 kg/m   Body mass index is 20.16  kg/m.  General appearance : Well developed well nourished female. No acute distress HEENT: Eyes: no retinal hemorrhage or exudates,  Neck supple, trachea midline, no carotid bruits, no thyroidmegaly Lungs: Clear to auscultation, no rhonchi or wheezes, or rib retractions  Heart: Regular rate and rhythm, no murmurs or gallops Breast:Examined in sitting and supine position were symmetrical in appearance, no palpable masses or tenderness,  no skin retraction, no nipple inversion, no nipple discharge, no skin discoloration, no axillary or supraclavicular lymphadenopathy Abdomen: no palpable masses or tenderness, no rebound or guarding Extremities: no edema or skin discoloration or tenderness  Pelvic: Vulva: Normal             Vagina: No gross lesions or discharge  Cervix: No gross lesions or discharge.  Pap reflex done.  Uterus  AV, normal size, shape and consistency, non-tender and mobile  Adnexa  Without masses or tenderness  Anus: Normal   Assessment/Plan:  55 y.o. female for annual exam   1. Encounter for routine gynecological examination with Papanicolaou smear of cervix Normal gynecologic exam in menopause.  Pap reflex done.  Breast exam normal.  Screening mammogram March 2021 was negative, will repeat a screening mammogram now.  We will organize screening colonoscopy through her family physician.  Body mass index 20.16.  Continue with fitness and healthy nutrition.  Health labs with family physician.  2. ASCUS with positive high risk HPV cervical Pap reflex done.  3. HPV vaccine counseling Would like to receive the HPV vaccine,  will verify insurance coverage.  4. Postmenopause  Well on no HRT.  No PMB.  Vit D supplement, Ca++ 1.5 g/d total  [name] MD, 2:41 PM 06/26/2020

## 2020-06-26 NOTE — Telephone Encounter (Signed)
[employer] for pt. They will cover at 100% reference number [id number]. Per rep pt can call  anytime to ask questions about what her insurance may or may not cover.

## 2020-06-26 NOTE — Telephone Encounter (Signed)
-----   Message from [name], MD sent at 06/26/2020  3:20 PM EDT ----- Regarding: HPV vaccine H/O LEEP.  H/O HR HPV.  Please verify benefits for HPV vaccine.

## 2020-06-28 ENCOUNTER — Encounter: Payer: Self-pay | Admitting: Obstetrics & Gynecology

## 2020-06-29 LAB — CYTOLOGY - PAP: Diagnosis: NEGATIVE

## 2021-02-09 ENCOUNTER — Telehealth: Payer: Self-pay | Admitting: Nurse Practitioner

## 2021-02-09 NOTE — Telephone Encounter (Signed)
Pt' husband is saying Rebecca Mcdowell has not been credentialed with [employer]. This is what they have told him.

## 2021-02-10 NOTE — Telephone Encounter (Signed)
I called pt and read her the message.

## 2021-02-10 NOTE — Telephone Encounter (Signed)
Her credentialing may be in the works BUT normally with [employer] pt has to list the supervising physician. Rebecca Mcdowell's supervising is Dr. Orlie Dakin.   Please notify pt to have [employer] list under Dr. Gena Fray

## 2021-03-04 NOTE — Progress Notes (Signed)
New Patient Office Visit  Subjective:  Patient ID: Rebecca Mcdowell, female    DOB: [birth year]  Age: 56 y.o. MRN: [id number]  CC:  Chief Complaint  Patient presents with   Establish Care    Np. Est care. Pt c/o hot flashes x3-4 at night, post-menopausal. Pt is fasting    HPI Rebecca Mcdowell presents for new patient visit to establish care.  Introduced to Designer, [employer]ellery role and practice setting.  All questions answered.  Discussed provider/patient relationship and expectations.  She endorses hot flashes since going through menopause 2 years ago. The hot flashes are worse at night. It causes her to wakes her up during the night and she is tired during the day. Her mom has a history of using hormone replacement therapy and got breast cancer, potentially from these hormones. She is not interested in HRT.   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 She worries about world events and parents' health at times during the day. It does not affect daily life.  Depression screen [facility] 2/9 03/05/2021 03/13/2019 12/22/2017  Decreased Interest 0 0 0  Down, Depressed, Hopeless 0 0 0  PHQ - 2 Score 0 0 0  Altered sleeping 1 - 0  Tired, decreased energy 1 - 1   Change in appetite 0 - 0  Feeling bad or failure about yourself  0 - 1  Trouble concentrating 0 - 0  Moving slowly or fidgety/restless 0 - 0  Suicidal thoughts 0 - 0  PHQ-9 Score 2 - 2  Difficult doing work/chores Somewhat difficult - -   GAD 7 : Generalized Anxiety Score 03/05/2021 12/22/2017  Nervous, Anxious, on Edge 0 1  Control/stop worrying 0 1  Worry too much - different things 1 0  Trouble relaxing 0 0  Restless 0 0  Easily annoyed or irritable 1 1  Afraid - awful might happen 1 1  Total GAD 7 Score 3 4  Anxiety Difficulty Not difficult at all -    Past Medical History:  Diagnosis Date   Abnormal Pap- s/p LEEP    Decreased hearing    Hyperlipidemia     Past Surgical History:  Procedure Laterality Date   LEEP     OVARIAN CYST REMOVAL     2011, removed left ovary and fallopian tube    Family History  Problem Relation Age of Onset   Hypertension Mother    Hyperlipidemia Mother    Breast cancer Mother    Rheum arthritis Mother    Kidney failure Mother    Hypertension Father    Hyperlipidemia Father    Heart attack Paternal Grandmother    Hypertension Paternal Grandmother     Social History   Socioeconomic History   Marital status: Married    Spouse name: Not on file   Number  of children: Not on file   Years of education: Not on file   Highest education level: Not on file  Occupational History   Not on file  Tobacco Use   Smoking status: Former    Years: 15.00    Types: Cigarettes   Smokeless tobacco: Never  Vaping Use   Vaping Use: Never used  Substance and Sexual Activity   Alcohol use: Yes    Comment: socially   Drug use: Never   Sexual activity: Not Currently    Partners: Male    Comment: Refused sexual hx question  Other Topics Concern   Not on file  Social History Narrative   Not on file   Social Determinants of Health   Financial Resource Strain: Not on file  Food Insecurity: Not on file  Transportation Needs: Not on file   Physical Activity: Not on file  Stress: Not on file  Social Connections: Not on file  Intimate Partner Violence: Not on file    ROS Review of Systems  Constitutional:  Positive for fatigue.  HENT:  Positive for congestion, postnasal drip, rhinorrhea, sneezing and sore throat. Negative for ear pain and sinus pressure.   Eyes: Negative.   Respiratory:  Positive for cough. Negative for chest tightness and shortness of breath.   Cardiovascular: Negative.   Gastrointestinal: Negative.   Genitourinary:  Negative for difficulty urinating, dysuria, frequency and hematuria.       Hot flashes   Musculoskeletal:  Positive for arthralgias (during the past few days).  Skin: Negative.   Neurological: Negative.   Psychiatric/Behavioral:  The patient is nervous/anxious.    Objective:   Today's Vitals: BP 118/66    Pulse 100    Temp (!) 97.1 F (36.2 C) (Temporal)    Ht 5' 2.5" (1.588 m)    Wt 108 lb 3.2 oz (49.1 kg)    LMP 04/21/2019 Comment: spotting   SpO2 100%    BMI 19.47 kg/m   Physical Exam Vitals and nursing note reviewed.  Constitutional:      General: She is not in acute distress.    Appearance: Normal appearance.  HENT:     Head: Normocephalic and atraumatic.     Right Ear: Tympanic membrane, ear canal and external ear normal.     Left Ear: Tympanic membrane, ear canal and external ear normal.     Nose: Nose normal.     Mouth/Throat:     Mouth: Mucous membranes are moist.     Pharynx: Oropharynx is clear.  Eyes:     Conjunctiva/sclera: Conjunctivae normal.  Cardiovascular:     Rate and Rhythm: Normal rate and regular rhythm.     Pulses: Normal pulses.     Heart sounds: Normal heart sounds.  Pulmonary:     Effort: Pulmonary effort is normal.     Breath sounds: Normal breath sounds.  Abdominal:     General: Bowel sounds are normal.     Palpations: Abdomen is soft.     Tenderness: There is no abdominal tenderness.  Musculoskeletal:        General: Normal range of  motion.     Cervical back: Normal range of motion.  Skin:    General: Skin is warm and dry.  Neurological:     General: No focal deficit present.     Mental Status: She is alert and oriented to person, place, and time.     Cranial Nerves: No cranial nerve deficit.     Coordination: Coordination normal.  Gait: Gait normal.  Psychiatric:        Mood and Affect: Mood normal.        Behavior: Behavior normal.        Thought Content: Thought content normal.        Judgment: Judgment normal.    Assessment & Plan:   Problem List Items Addressed This Visit       Cardiovascular and Mediastinum   Hot flashes    Ongoing for several years, hot flashes are worse at night. Discussed various treatment options. She is not interested in hormone replacement therapy. Discussed options including black kohosh herbal supplement, effexor, gabapentin, and clonidine. She would like to look more into the prescription options before deciding. Follow up with any concerns or if she would like to start medication.        Other   Generalized anxiety disorder    Chronic, stable. She states that she worries at points during the day, especially if she watches the news. Her anxiety is not affecting her daily life and she is not interested in medication at this time. Follow up with any concerns.       Hyperlipidemia    Last lipid panel on 03/13/19 showed total cholesterol 300, triglycerides 167, and LDL 206. She was encouraged to start lipitor, however she never started this medication. She does not like to take medications. Will recheck lipid panel today and treat based on results.       Relevant Orders   Lipid panel (Completed)   History of abnormal cervical Pap smear    She has a history of abnormal pap and hpv. She had LEEP procedure several years ago. She states her last pap was normal last year. Continue collaboration and recommendations from GYN.       Other Visit Diagnoses     Routine general  medical examination at a health care facility    -  Primary   Health maintenance reviewed and updated. She would like to wait on her Td booster and colon cancer screening for now. Check CMP, CBC   Relevant Orders   CBC with Differential/Platelet (Completed)   Comprehensive metabolic panel (Completed)   Upper respiratory tract infection, unspecified type       Most likely viral. negative covid-19 test at home. encourage fluids, can use flonase and OTC cold/allergy medication. F/U if not improving.       No outpatient encounter medications on file as of 03/05/2021.   No facility-administered encounter medications on file as of 03/05/2021.    Follow-up: Return in about 1 year (around 03/05/2022) for CPE.   Charyl Dancer, NP

## 2021-03-05 ENCOUNTER — Ambulatory Visit (INDEPENDENT_AMBULATORY_CARE_PROVIDER_SITE_OTHER): Payer: No Typology Code available for payment source | Admitting: Nurse Practitioner

## 2021-03-05 ENCOUNTER — Other Ambulatory Visit: Payer: Self-pay

## 2021-03-05 ENCOUNTER — Encounter: Payer: Self-pay | Admitting: Nurse Practitioner

## 2021-03-05 VITALS — BP 118/66 | HR 100 | Temp 97.1°F | Ht 62.5 in | Wt 108.2 lb

## 2021-03-05 DIAGNOSIS — Z0000 Encounter for general adult medical examination without abnormal findings: Secondary | ICD-10-CM | POA: Diagnosis not present

## 2021-03-05 DIAGNOSIS — E785 Hyperlipidemia, unspecified: Secondary | ICD-10-CM | POA: Diagnosis not present

## 2021-03-05 DIAGNOSIS — J069 Acute upper respiratory infection, unspecified: Secondary | ICD-10-CM

## 2021-03-05 DIAGNOSIS — R232 Flushing: Secondary | ICD-10-CM | POA: Diagnosis not present

## 2021-03-05 DIAGNOSIS — F411 Generalized anxiety disorder: Secondary | ICD-10-CM

## 2021-03-05 DIAGNOSIS — Z8742 Personal history of other diseases of the female genital tract: Secondary | ICD-10-CM

## 2021-03-05 LAB — COMPREHENSIVE METABOLIC PANEL
ALT: 17 U/L (ref 0–35)
AST: 21 U/L (ref 0–37)
Albumin: 4.6 g/dL (ref 3.5–5.2)
Alkaline Phosphatase: 77 U/L (ref 39–117)
BUN: 18 mg/dL (ref 6–23)
CO2: 28 mEq/L (ref 19–32)
Calcium: 10 mg/dL (ref 8.4–10.5)
Chloride: 105 mEq/L (ref 96–112)
Creatinine, Ser: 0.67 mg/dL (ref 0.40–1.20)
GFR: 99.57 mL/min (ref >60.00)
Glucose, Bld: 83 mg/dL (ref 70–99)
Potassium: 4.1 mEq/L (ref 3.5–5.1)
Sodium: 139 mEq/L (ref 135–145)
Total Bilirubin: 0.5 mg/dL (ref 0.2–1.2)
Total Protein: 7.5 g/dL (ref 6.0–8.3)

## 2021-03-05 LAB — CBC WITH DIFFERENTIAL/PLATELET
Basophils Absolute: 0 10*3/uL (ref 0.0–0.1)
Basophils Relative: 0.6 % (ref 0.0–3.0)
Eosinophils Absolute: 0 10*3/uL (ref 0.0–0.7)
Eosinophils Relative: 0.4 % (ref 0.0–5.0)
HCT: 38 % (ref 36.0–46.0)
Hemoglobin: 13.5 g/dL (ref 12.0–15.0)
Lymphocytes Relative: 10.7 % — ABNORMAL LOW (ref 12.0–46.0)
Lymphs Abs: 0.5 10*3/uL — ABNORMAL LOW (ref 0.7–4.0)
MCHC: 35.5 g/dL (ref 30.0–36.0)
MCV: 94.1 fl (ref 78.0–100.0)
Monocytes Absolute: 0.4 10*3/uL (ref 0.1–1.0)
Monocytes Relative: 7.5 % (ref 3.0–12.0)
Neutro Abs: 3.9 10*3/uL (ref 1.4–7.7)
Neutrophils Relative %: 80.8 % — ABNORMAL HIGH (ref 43.0–77.0)
Platelets: 240 10*3/uL (ref 150.0–400.0)
RBC: 4.04 Mil/uL (ref 3.87–5.11)
RDW: 14.1 % (ref 11.5–15.5)
WBC: 4.9 10*3/uL (ref 4.0–10.5)

## 2021-03-05 LAB — LIPID PANEL
Cholesterol: 304 mg/dL — ABNORMAL HIGH (ref 0–200)
HDL: 64.7 mg/dL (ref >39.00)
LDL Cholesterol: 220 mg/dL — ABNORMAL HIGH (ref 0–99)
NonHDL: 239.66
Total CHOL/HDL Ratio: 5
Triglycerides: 100 mg/dL (ref 0.0–149.0)
VLDL: 20 mg/dL (ref 0.0–40.0)

## 2021-03-05 NOTE — Patient Instructions (Signed)
It was great to see you!  Here are some medications for hot flashes:  Black kohosh - over the counter supplement Gabapentin  Effexor - can help with anxiety and depression Clonidine - patch can help with BP (not that you need it)  Future health maintenance: Tetanus booster, colonoscopy, and hepatitis C screening  Let's follow-up in 1 year, sooner if you have concerns.  If a referral was placed today, you will be contacted for an appointment. Please note that routine referrals can sometimes take up to 3-4 weeks to process. Please call our office if you haven't heard anything after this time frame.  Take care,  Vance Peper, NP

## 2021-03-05 NOTE — Assessment & Plan Note (Signed)
Last lipid panel on 03/13/19 showed total cholesterol 300, triglycerides 167, and LDL 206. She was encouraged to start lipitor, however she never started this medication. She does not like to take medications. Will recheck lipid panel today and treat based on results.

## 2021-03-05 NOTE — Assessment & Plan Note (Signed)
Ongoing for several years, hot flashes are worse at night. Discussed various treatment options. She is not interested in hormone replacement therapy. Discussed options including black kohosh herbal supplement, effexor, gabapentin, and clonidine. She would like to look more into the prescription options before deciding. Follow up with any concerns or if she would like to start medication.

## 2021-03-05 NOTE — Assessment & Plan Note (Signed)
She has a history of abnormal pap and hpv. She had LEEP procedure several years ago. She states her last pap was normal last year. Continue collaboration and recommendations from GYN.

## 2021-03-05 NOTE — Assessment & Plan Note (Signed)
Chronic, stable. She states that she worries at points during the day, especially if she watches the news. Her anxiety is not affecting her daily life and she is not interested in medication at this time. Follow up with any concerns.

## 2021-03-26 ENCOUNTER — Ambulatory Visit (INDEPENDENT_AMBULATORY_CARE_PROVIDER_SITE_OTHER): Payer: No Typology Code available for payment source | Admitting: Nurse Practitioner

## 2021-03-26 ENCOUNTER — Encounter: Payer: Self-pay | Admitting: Nurse Practitioner

## 2021-03-26 ENCOUNTER — Other Ambulatory Visit: Payer: Self-pay

## 2021-03-26 VITALS — BP 112/80 | HR 86 | Temp 96.5°F | Wt 107.8 lb

## 2021-03-26 DIAGNOSIS — Z1159 Encounter for screening for other viral diseases: Secondary | ICD-10-CM | POA: Diagnosis not present

## 2021-03-26 DIAGNOSIS — E785 Hyperlipidemia, unspecified: Secondary | ICD-10-CM

## 2021-03-26 DIAGNOSIS — Z23 Encounter for immunization: Secondary | ICD-10-CM | POA: Diagnosis not present

## 2021-03-26 MED ORDER — ATORVASTATIN CALCIUM 40 MG PO TABS: 40.0000 mg | ORAL_TABLET | Freq: Every day | ORAL | 2 refills | Status: DC

## 2021-03-26 NOTE — Progress Notes (Signed)
? ?Established Patient Office Visit ? ?Subjective:  ?Patient ID: Rebecca Mcdowell, female    DOB: Dec 31, 1965  Age: 56 y.o. MRN: [id number] ? ?CC:  ?Chief Complaint  ?Patient presents with  ? Follow-up  ?  Check hep c and tetanus vaccine  ? ? ?HPI ?Rebecca Mcdowell presents for follow up on elevated cholesterol levels with recent labs. She is thinking that she needs to start cholesterol medication as she was recommended this in the past but was hesitant to start. She denies chest pain, shortness of breath, and dizziness.  ? ?Past Medical History:  ?Diagnosis Date  ? Abnormal Pap- s/p LEEP   ? Decreased hearing   ? Hyperlipidemia   ? ? ?Past Surgical History:  ?Procedure Laterality Date  ? LEEP    ? OVARIAN CYST REMOVAL    ? 2011, removed left ovary and fallopian tube  ? ? ?Family History  ?Problem Relation Age of Onset  ? Hypertension Mother   ? Hyperlipidemia Mother   ? Breast cancer Mother   ? Rheum arthritis Mother   ? Kidney failure Mother   ? Hypertension Father   ? Hyperlipidemia Father   ? Heart attack Paternal Grandmother   ? Hypertension Paternal Grandmother   ? ? ?Social History  ? ?Socioeconomic History  ? Marital status: Married  ?  Spouse name: Not on file  ? Number of children: Not on file  ? Years of education: Not on file  ? Highest education level: Not on file  ?Occupational History  ? Not on file  ?Tobacco Use  ? Smoking status: Former  ?  Years: 15.00  ?  Types: Cigarettes  ? Smokeless tobacco: Never  ?Vaping Use  ? Vaping Use: Never used  ?Substance and Sexual Activity  ? Alcohol use: Yes  ?  Comment: socially  ? Drug use: Never  ? Sexual activity: Not Currently  ?  Partners: Male  ?  Comment: Refused sexual hx question  ?Other Topics Concern  ? Not on file  ?Social History Narrative  ? Not on file  ? ?Social Determinants of Health  ? ?Financial Resource Strain: Not on file  ?Food Insecurity: Not on file  ?Transportation Needs: Not on file  ?Physical Activity: Not on file  ?Stress: Not on  file  ?Social Connections: Not on file  ?Intimate Partner Violence: Not on file  ? ? ?No outpatient medications prior to visit.  ? ?No facility-administered medications prior to visit.  ? ? ?No Known Allergies ? ?ROS ?Review of Systems ?See pertinent positives and negatives per HPI. ?  ?Objective:  ?  ?Physical Exam ?Vitals and nursing note reviewed.  ?Constitutional:   ?   General: She is not in acute distress. ?   Appearance: Normal appearance.  ?HENT:  ?   Head: Normocephalic and atraumatic.  ?Eyes:  ?   Conjunctiva/sclera: Conjunctivae normal.  ?Cardiovascular:  ?   Rate and Rhythm: Normal rate.  ?Pulmonary:  ?   Effort: Pulmonary effort is normal.  ?Musculoskeletal:  ?   Cervical back: Normal range of motion.  ?Skin: ?   General: Skin is warm and dry.  ?Neurological:  ?   General: No focal deficit present.  ?   Mental Status: She is alert and oriented to person, place, and time.  ?Psychiatric:     ?   Mood and Affect: Mood normal.     ?   Behavior: Behavior normal.     ?   Thought Content: Thought  content normal.     ?   Judgment: Judgment normal.  ? ? ?BP 112/80 (BP Location: Left Arm, Patient Position: Sitting, Cuff Size: Normal)   Pulse 86   Temp (!) 96.5 ?F (35.8 ?C) (Temporal)   Wt 107 lb 12.8 oz (48.9 kg)   LMP 04/21/2019 Comment: spotting  SpO2 99%   BMI 19.40 kg/m?  ?Wt Readings from Last 3 Encounters:  ?03/26/21 107 lb 12.8 oz (48.9 kg)  ?03/05/21 108 lb 3.2 oz (49.1 kg)  ?06/26/20 112 lb (50.8 kg)  ? ? ? ?Health Maintenance Due  ?Topic Date Due  ? Hepatitis C Screening  Never done  ? Fecal DNA (Cologuard)  Never done  ? ? ?There are no preventive care reminders to display for this patient. ? ?Lab Results  ?Component Value Date  ? TSH 1.870 03/13/2019  ? ?Lab Results  ?Component Value Date  ? WBC 4.9 03/05/2021  ? HGB 13.5 03/05/2021  ? HCT 38.0 03/05/2021  ? MCV 94.1 03/05/2021  ? PLT 240.0 03/05/2021  ? ?Lab Results  ?Component Value Date  ? NA 139 03/05/2021  ? K 4.1 03/05/2021  ? CO2 28  03/05/2021  ? GLUCOSE 83 03/05/2021  ? BUN 18 03/05/2021  ? CREATININE 0.67 03/05/2021  ? BILITOT 0.5 03/05/2021  ? ALKPHOS 77 03/05/2021  ? AST 21 03/05/2021  ? ALT 17 03/05/2021  ? PROT 7.5 03/05/2021  ? ALBUMIN 4.6 03/05/2021  ? CALCIUM 10.0 03/05/2021  ? GFR 99.57 03/05/2021  ? ?Lab Results  ?Component Value Date  ? CHOL 304 (H) 03/05/2021  ? ?Lab Results  ?Component Value Date  ? HDL 64.70 03/05/2021  ? ?Lab Results  ?Component Value Date  ? LDLCALC 220 (H) 03/05/2021  ? ?Lab Results  ?Component Value Date  ? TRIG 100.0 03/05/2021  ? ?Lab Results  ?Component Value Date  ? CHOLHDL 5 03/05/2021  ? ?Lab Results  ?Component Value Date  ? HGBA1C 5.6 12/21/2017  ? ? ?  ?Assessment & Plan:  ? ?Problem List Items Addressed This Visit   ? ?  ? Other  ? Hyperlipidemia - Primary  ?  Chronic, ongoing. Total cholesterol was 304 and LDL was 220. Will start atorvastatin '40mg'$  daily. Discussed possible side effects. Follow up in 3 months.  ?  ?  ? Relevant Medications  ? atorvastatin (LIPITOR) 40 MG tablet  ? ?Other Visit Diagnoses   ? ? Need for Tdap vaccination      ? Tdap booster updated today  ? Relevant Orders  ? Tdap vaccine greater than or equal to 7yo IM (Completed)  ? Encounter for hepatitis C screening test for low risk patient      ? Screen for hepatitis C today  ? Relevant Orders  ? Hepatitis C antibody  ? ?  ? ? ?Meds ordered this encounter  ?Medications  ? atorvastatin (LIPITOR) 40 MG tablet  ?  Sig: Take 1 tablet (40 mg total) by mouth daily.  ?  Dispense:  30 tablet  ?  Refill:  2  ? ? ?Follow-up: Return in about 3 months (around 06/26/2021) for HLD.  ? ? ?Charyl Dancer, NP ?

## 2021-03-26 NOTE — Assessment & Plan Note (Signed)
Chronic, ongoing. Total cholesterol was 304 and LDL was 220. Will start atorvastatin '40mg'$  daily. Discussed possible side effects. Follow up in 3 months.  ?

## 2021-03-26 NOTE — Patient Instructions (Signed)
It was great to see you! ? ?We are checking your hepatitis C today.   ? ?Start lipitor (atorvastatin) 1 tablet daily at night.  ? ?Let's follow-up in 3 months, sooner if you have concerns. ? ?If a referral was placed today, you will be contacted for an appointment. Please note that routine referrals can sometimes take up to 3-4 weeks to process. Please call our office if you haven't heard anything after this time frame. ? ?Take care, ? ?Vance Peper, NP ? ?

## 2021-03-29 LAB — HEPATITIS C ANTIBODY
Hepatitis C Ab: NONREACTIVE
SIGNAL TO CUT-OFF: <0.02 (ref <1.00)

## 2021-04-16 LAB — HM MAMMOGRAPHY

## 2021-04-21 NOTE — Progress Notes (Unsigned)
mamm

## 2021-06-28 ENCOUNTER — Ambulatory Visit: Payer: No Typology Code available for payment source | Admitting: Nurse Practitioner

## 2021-07-01 NOTE — Progress Notes (Signed)
   Established Patient Office Visit  Subjective   Patient ID: Rebecca Mcdowell, female    DOB: Nov 25, 1965  Age: 56 y.o. MRN: [id number]  Chief Complaint  Patient presents with   Follow-up    3 mo f/u HLD    HPI  Rebecca Mcdowell is here to follow-up on hyperlipidemia. Last visit she was started on atorvastatin '40mg'$  daily.  She states that she does not remember to take the medication every day, however is taking it some days during the week.  She has noticed that the next day after taking it she does have some abdominal cramping and is not sure if this is from the medication or not.  She denies muscle cramping, body aches, chest pain, shortness of breath.    ROS See pertinent positives and negatives per HPI.    Objective:     BP 130/86 (BP Location: Left Arm, Patient Position: Sitting, Cuff Size: Normal)   Pulse 94   Temp 97.8 F (36.6 C) (Temporal)   Wt 107 lb 6.4 oz (48.7 kg)   LMP 04/21/2019 Comment: spotting  SpO2 100%   BMI 19.33 kg/m    Physical Exam Vitals and nursing note reviewed.  Constitutional:      General: She is not in acute distress.    Appearance: Normal appearance.  HENT:     Head: Normocephalic.  Eyes:     Conjunctiva/sclera: Conjunctivae normal.  Cardiovascular:     Rate and Rhythm: Normal rate and regular rhythm.     Pulses: Normal pulses.     Heart sounds: Normal heart sounds.  Pulmonary:     Effort: Pulmonary effort is normal.     Breath sounds: Normal breath sounds.  Musculoskeletal:     Cervical back: Normal range of motion.  Skin:    General: Skin is warm.  Neurological:     General: No focal deficit present.     Mental Status: She is alert and oriented to person, place, and time.  Psychiatric:        Mood and Affect: Mood normal.        Behavior: Behavior normal.        Thought Content: Thought content normal.        Judgment: Judgment normal.     The 10-year ASCVD risk score (Arnett DK, et al., 2019) is: 2.5%    Assessment &  Plan:   Problem List Items Addressed This Visit       Other   Hyperlipidemia - Primary    She has been taking atorvastatin 40 mg every other day or every few days.  She states that sometimes she does have some stomach cramping after taking the medication the next day.  Encouraged her to keep monitoring and see if this happens every time.  We will have her come back for fasting lipid panel and CMP.  For now continue atorvastatin 40 mg daily.  If she is still having the stomach cramping we will change her over to rosuvastatin 20 mg daily.  Follow-up in 6 months.      Relevant Orders   Lipid panel   Comprehensive metabolic panel    Return in about 6 months (around 01/01/2022) for HLD.    Charyl Dancer, NP

## 2021-07-02 ENCOUNTER — Ambulatory Visit (INDEPENDENT_AMBULATORY_CARE_PROVIDER_SITE_OTHER): Payer: No Typology Code available for payment source | Admitting: Nurse Practitioner

## 2021-07-02 ENCOUNTER — Encounter: Payer: Self-pay | Admitting: Nurse Practitioner

## 2021-07-02 VITALS — BP 130/86 | HR 94 | Temp 97.8°F | Wt 107.4 lb

## 2021-07-02 DIAGNOSIS — E785 Hyperlipidemia, unspecified: Secondary | ICD-10-CM

## 2021-07-02 NOTE — Assessment & Plan Note (Signed)
She has been taking atorvastatin 40 mg every other day or every few days.  She states that sometimes she does have some stomach cramping after taking the medication the next day.  Encouraged her to keep monitoring and see if this happens every time.  We will have her come back for fasting lipid panel and CMP.  For now continue atorvastatin 40 mg daily.  If she is still having the stomach cramping we will change her over to rosuvastatin 20 mg daily.  Follow-up in 6 months.

## 2021-07-02 NOTE — Patient Instructions (Signed)
It was great to see you!  Continue taking the lipitor (daily if able). If you are still having the cramps after taking it, let me know and we can change it to a different medication.   Schedule a lab visit fasting to recheck your cholesterol.   Let's follow-up in 6 months, sooner if you have concerns.  If a referral was placed today, you will be contacted for an appointment. Please note that routine referrals can sometimes take up to 3-4 weeks to process. Please call our office if you haven't heard anything after this time frame.  Take care,  Vance Peper, NP

## 2021-07-16 ENCOUNTER — Other Ambulatory Visit (INDEPENDENT_AMBULATORY_CARE_PROVIDER_SITE_OTHER): Payer: No Typology Code available for payment source

## 2021-07-16 ENCOUNTER — Other Ambulatory Visit: Payer: No Typology Code available for payment source | Admitting: Nurse Practitioner

## 2021-07-16 DIAGNOSIS — E785 Hyperlipidemia, unspecified: Secondary | ICD-10-CM

## 2021-07-16 LAB — COMPREHENSIVE METABOLIC PANEL
ALT: 21 U/L (ref 0–35)
AST: 18 U/L (ref 0–37)
Albumin: 4.6 g/dL (ref 3.5–5.2)
Alkaline Phosphatase: 73 U/L (ref 39–117)
BUN: 22 mg/dL (ref 6–23)
CO2: 29 mEq/L (ref 19–32)
Calcium: 9.9 mg/dL (ref 8.4–10.5)
Chloride: 106 mEq/L (ref 96–112)
Creatinine, Ser: 0.78 mg/dL (ref 0.40–1.20)
GFR: 86.3 mL/min (ref >60.00)
Glucose, Bld: 77 mg/dL (ref 70–99)
Potassium: 4.1 mEq/L (ref 3.5–5.1)
Sodium: 141 mEq/L (ref 135–145)
Total Bilirubin: 0.4 mg/dL (ref 0.2–1.2)
Total Protein: 7.5 g/dL (ref 6.0–8.3)

## 2021-07-16 LAB — LIPID PANEL
Cholesterol: 214 mg/dL — ABNORMAL HIGH (ref 0–200)
HDL: 66.3 mg/dL (ref >39.00)
LDL Cholesterol: 134 mg/dL — ABNORMAL HIGH (ref 0–99)
NonHDL: 147.43
Total CHOL/HDL Ratio: 3
Triglycerides: 66 mg/dL (ref 0.0–149.0)
VLDL: 13.2 mg/dL (ref 0.0–40.0)

## 2021-10-15 ENCOUNTER — Ambulatory Visit: Payer: No Typology Code available for payment source

## 2021-10-22 ENCOUNTER — Ambulatory Visit (INDEPENDENT_AMBULATORY_CARE_PROVIDER_SITE_OTHER): Payer: No Typology Code available for payment source

## 2021-10-22 ENCOUNTER — Encounter: Payer: Self-pay | Admitting: Nurse Practitioner

## 2021-10-22 DIAGNOSIS — Z23 Encounter for immunization: Secondary | ICD-10-CM | POA: Diagnosis not present

## 2021-10-22 NOTE — Progress Notes (Signed)
Per orders of Vance Peper, NP, pt is here for Influenza vaccine. pt received vaccine in right deltoid. Given by Marcy Salvo . Pt tolerated vaccine well.

## 2021-12-22 IMAGING — DX DG KNEE COMPLETE 4+V*L*
4 series · 4 of 4 positions shown · non-contrast
Comparison: None.

CLINICAL DATA: Left knee pain for 2 weeks after fall

EXAM:
LEFT KNEE - COMPLETE 4+ VIEW

[knee ap (1 of 3)]
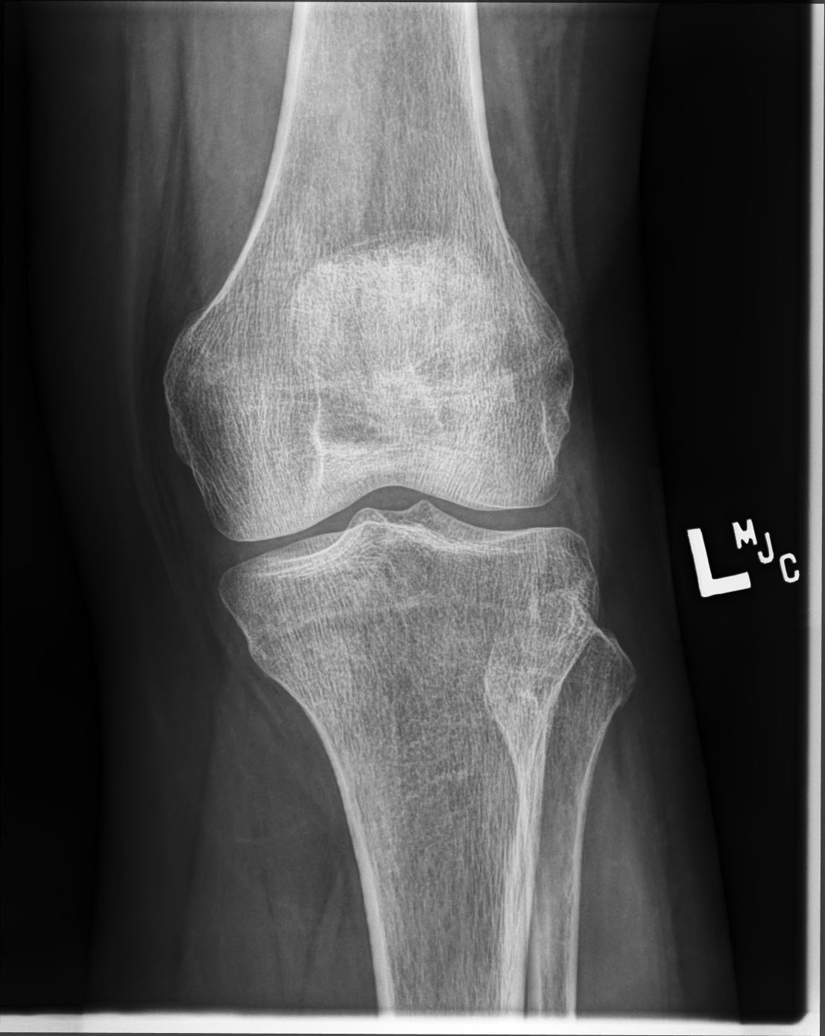

[knee ap (2 of 3)]
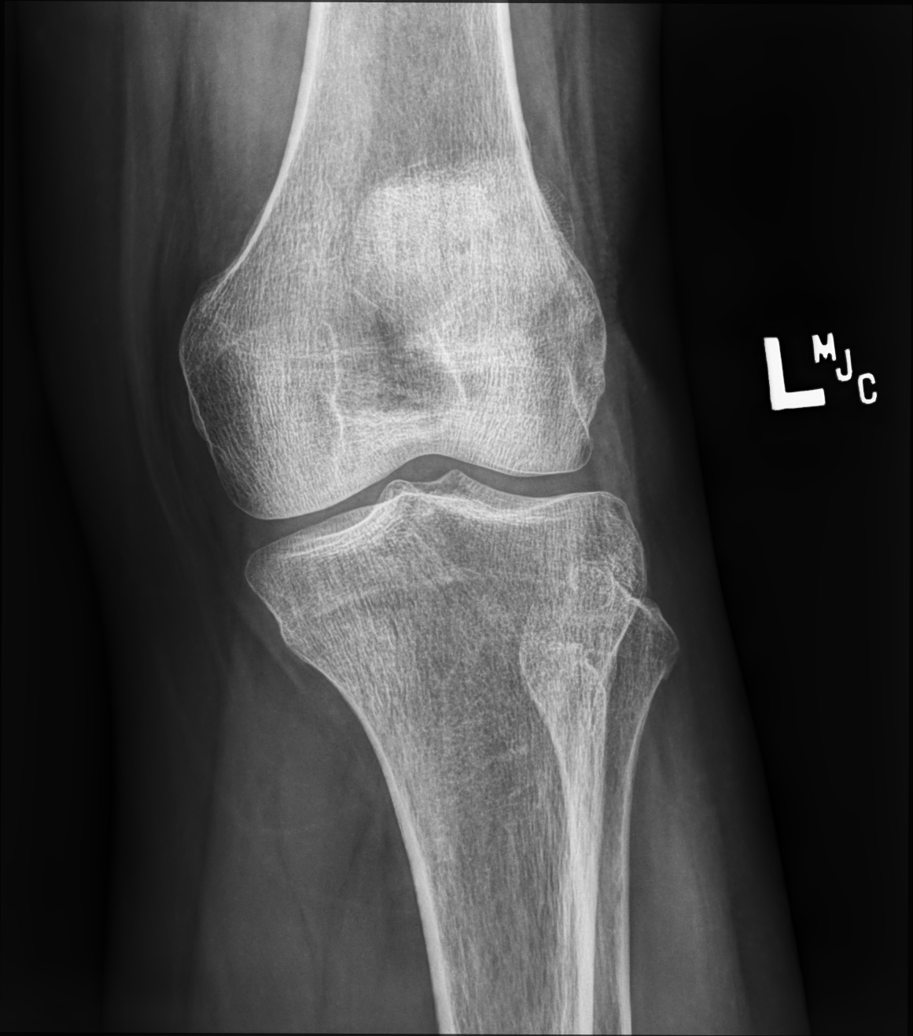

[knee ap (3 of 3)]
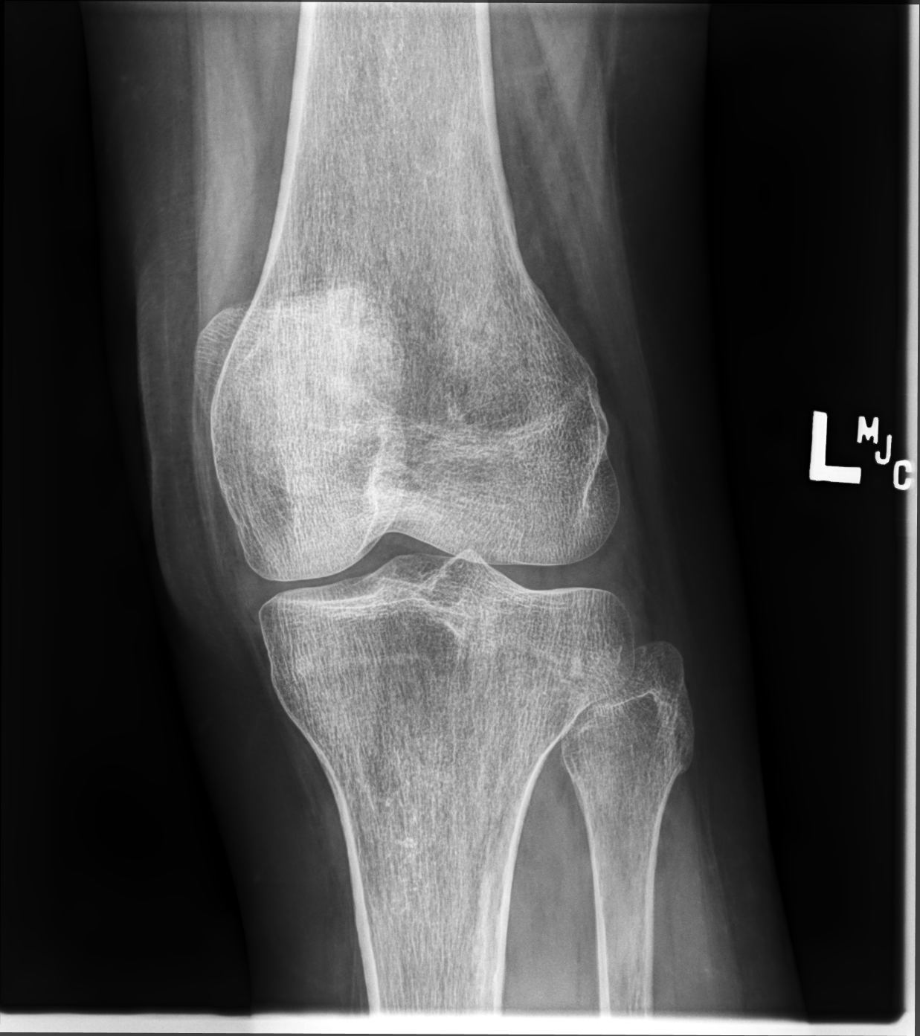

[knee lat]
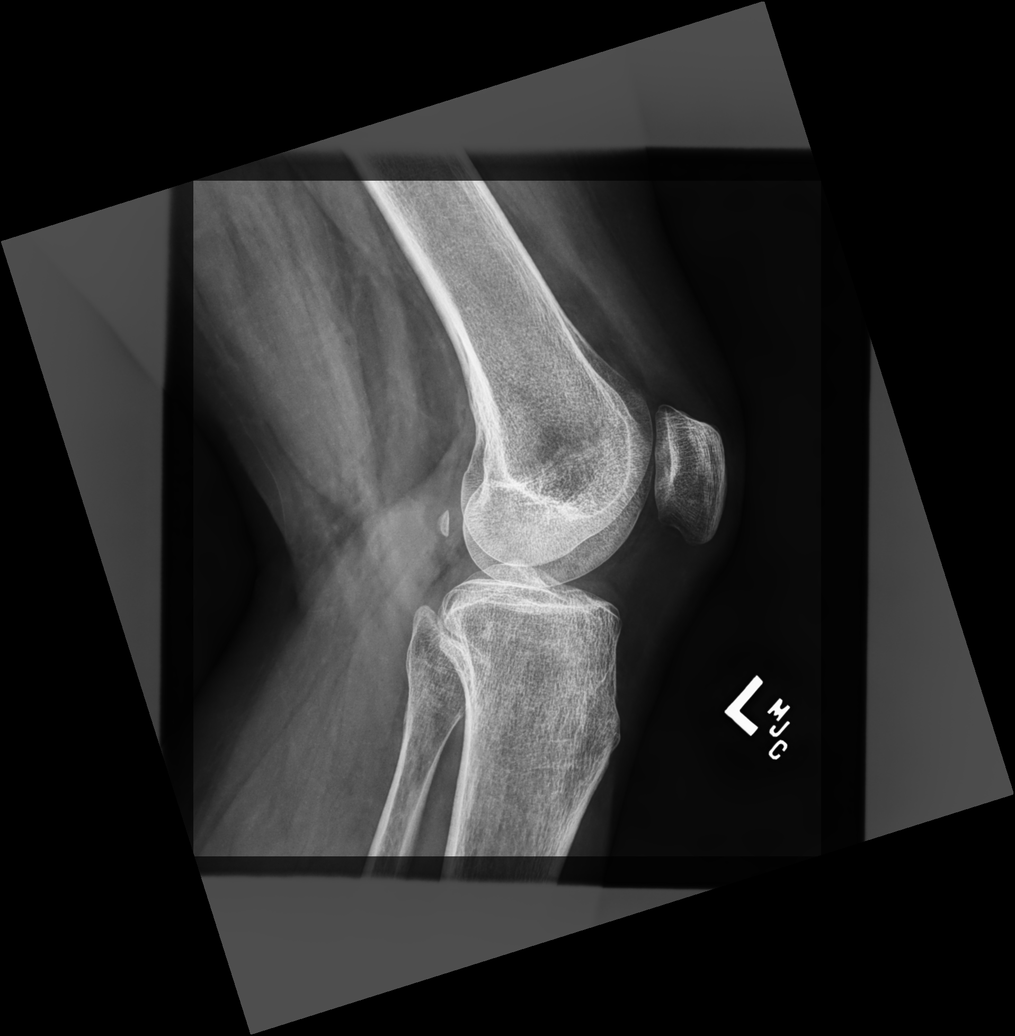

[4 of 4 positions shown; findings below may reference images not displayed]

FINDINGS: No evidence of fracture, dislocation, or joint effusion. No evidence
of arthropathy or other focal bone abnormality. Increased density
within the soft tissues of the posterior joint line could reflect a
Baker's cyst.
IMPRESSION: 1. No acute osseous abnormality or joint effusion.
2. Increased density within the soft tissues posterior to the knee
joint which could reflect a Baker's cyst.

## 2022-01-01 NOTE — Progress Notes (Unsigned)
   Established Patient Office Visit  Subjective   Patient ID: Rebecca Mcdowell, female    DOB: 10/11/1965  Age: 56 y.o. MRN: [id number]  No chief complaint on file.   HPI  Rebecca Mcdowell is here to follow-up on hyperlipidemia.   HYPERLIPIDEMIA  Hyperlipidemia status: {Blank single:19197::"excellent compliance","good compliance","fair compliance","poor compliance"} Satisfied with current treatment?  {Blank single:19197::"yes","no"} Side effects:  {Blank single:19197::"yes","no"} Medication compliance: {Blank single:19197::"excellent compliance","good compliance","fair compliance","poor compliance"} Past cholesterol meds: rosuvastatin Supplements: none Aspirin:  no The 10-year ASCVD risk score (Arnett DK, et al., 2019) is: 1.7%   Values used to calculate the score:     Age: 61 years     Sex: Female     Is Non-Hispanic African American: No     Diabetic: No     Tobacco smoker: No     Systolic Blood Pressure: 697 mmHg     Is BP treated: No     HDL Cholesterol: 66.3 mg/dL     Total Cholesterol: 214 mg/dL Chest pain:  {Blank single:19197::"yes","no"} Coronary artery disease:  {Blank single:19197::"yes","no"} Family history CAD:  {Blank single:19197::"yes","no"} Family history early CAD:  {Blank single:19197::"yes","no"}   {History (Optional):23778}  ROS    Objective:     LMP 04/21/2019 Comment: spotting {Vitals History (Optional):23777}  Physical Exam   No results found for any visits on 01/03/22.  {Labs (Optional):23779}  The 10-year ASCVD risk score (Arnett DK, et al., 2019) is: 1.7%    Assessment & Plan:   Problem List Items Addressed This Visit   None   No follow-ups on file.    Charyl Dancer, NP

## 2022-01-03 ENCOUNTER — Ambulatory Visit (INDEPENDENT_AMBULATORY_CARE_PROVIDER_SITE_OTHER): Payer: No Typology Code available for payment source | Admitting: Nurse Practitioner

## 2022-01-03 ENCOUNTER — Encounter: Payer: Self-pay | Admitting: Nurse Practitioner

## 2022-01-03 VITALS — BP 122/70 | HR 91 | Temp 98.0°F | Ht 62.5 in | Wt 115.5 lb

## 2022-01-03 DIAGNOSIS — E7800 Pure hypercholesterolemia, unspecified: Secondary | ICD-10-CM

## 2022-01-03 DIAGNOSIS — R21 Rash and other nonspecific skin eruption: Secondary | ICD-10-CM | POA: Diagnosis not present

## 2022-01-03 NOTE — Assessment & Plan Note (Signed)
Chronic, stable.  She has been taking the atorvastatin every few days.  She has been having some joint pain and is unsure if it is related to the atorvastatin or if it would have happened anyway.  She would like to continue taking the atorvastatin for now.  Discussed switching to rosuvastatin if she is still having joint pain.  Will check labs next visit.  Follow-up in 6 months.

## 2022-01-03 NOTE — Patient Instructions (Signed)
It was great to see you!  Start either claritin (loratadine) or zyrtec (Cetirizine) '10mg'$  at bedtime to help with the rash.   Let's follow-up in 6 months, sooner if you have concerns.  If a referral was placed today, you will be contacted for an appointment. Please note that routine referrals can sometimes take up to 3-4 weeks to process. Please call our office if you haven't heard anything after this time frame.  Take care,  Vance Peper, NP

## 2022-01-03 NOTE — Assessment & Plan Note (Signed)
She has been having an intermittent rash over her abdomen and back for the last year.  She can continue Caladryl as needed for itching.  She can try taking cetirizine or loratadine 10 mg daily at bedtime.  She can also continue to monitor for triggers.  If still ongoing, will refer to dermatology.

## 2022-03-04 ENCOUNTER — Encounter: Payer: Self-pay | Admitting: Nurse Practitioner

## 2022-03-04 ENCOUNTER — Ambulatory Visit (INDEPENDENT_AMBULATORY_CARE_PROVIDER_SITE_OTHER): Payer: 59 | Admitting: Nurse Practitioner

## 2022-03-04 VITALS — BP 122/76 | HR 89 | Temp 98.6°F | Ht 62.5 in | Wt 119.0 lb

## 2022-03-04 DIAGNOSIS — M779 Enthesopathy, unspecified: Secondary | ICD-10-CM | POA: Diagnosis not present

## 2022-03-04 NOTE — Patient Instructions (Signed)
It was great to see you!  Start voltaren gel 4 times a day as needed to your wrist.   You can wear a thumb spica splint to your wrist at bedtime. Then if needed you can wear it during the day.   I am attaching stretches that can help.   Let's follow-up if your symptoms worsen or don't improve. Please send me a mychart message or call in 2 weeks if your pain is not improving.   Take care,  Vance Peper, NP

## 2022-03-04 NOTE — Progress Notes (Signed)
   Established Patient Office Visit  Subjective   Patient ID: Rebecca Mcdowell, female    DOB: 06/18/1965  Age: 57 y.o. MRN: [id number]  Chief Complaint  Patient presents with   Wrist Pain    Left wrist pain    HPI  Rebecca Mcdowell is here for pain in her left wrist. This occurred after she twisted it scooping cat litter about 6 weeks ago. The pain will radiate to her hand and up her forearm. She states her wrist hurts worse with writing. She has tried ice once which made the pain worse. She states the pain is intermittent.     ROS See pertinent positives and negatives per HPI.    Objective:     BP 122/76 (BP Location: Right Arm)   Pulse 89   Temp 98.6 F (37 C)   Ht 5' 2.5" (1.588 m)   Wt 119 lb (54 kg)   LMP 04/21/2019 Comment: spotting  SpO2 100%   BMI 21.42 kg/m    Physical Exam Vitals and nursing note reviewed.  Constitutional:      General: She is not in acute distress.    Appearance: Normal appearance.  HENT:     Head: Normocephalic.  Eyes:     Conjunctiva/sclera: Conjunctivae normal.  Pulmonary:     Effort: Pulmonary effort is normal.  Musculoskeletal:        General: Tenderness (left medial wrist) present. No swelling. Normal range of motion.     Cervical back: Normal range of motion.     Comments: Positive finklestein's test to left thumb  Skin:    General: Skin is warm.  Neurological:     General: No focal deficit present.     Mental Status: She is alert and oriented to person, place, and time.  Psychiatric:        Mood and Affect: Mood normal.        Behavior: Behavior normal.        Thought Content: Thought content normal.        Judgment: Judgment normal.    The 10-year ASCVD risk score (Arnett DK, et al., 2019) is: 1.5%    Assessment & Plan:   Problem List Items Addressed This Visit   None Visit Diagnoses     Tendonitis    -  Primary   Left wrist x6 weeks.Wear a thumb-spica brace at night. Use voltaren QID prn  pain.Stretches given to do daily. F/U if not improving, will place referral to ortho       Return if symptoms worsen or fail to improve.    Charyl Dancer, NP

## 2022-06-09 ENCOUNTER — Telehealth: Payer: Self-pay | Admitting: Nurse Practitioner

## 2022-06-10 NOTE — Telephone Encounter (Signed)
error 

## 2022-06-29 ENCOUNTER — Encounter: Payer: Self-pay | Admitting: Nurse Practitioner

## 2022-06-29 ENCOUNTER — Ambulatory Visit (INDEPENDENT_AMBULATORY_CARE_PROVIDER_SITE_OTHER): Payer: 59 | Admitting: Nurse Practitioner

## 2022-06-29 VITALS — BP 104/70 | HR 75 | Temp 97.1°F | Ht 62.5 in | Wt 122.8 lb

## 2022-06-29 DIAGNOSIS — M79671 Pain in right foot: Secondary | ICD-10-CM | POA: Diagnosis not present

## 2022-06-29 DIAGNOSIS — F411 Generalized anxiety disorder: Secondary | ICD-10-CM

## 2022-06-29 DIAGNOSIS — E7800 Pure hypercholesterolemia, unspecified: Secondary | ICD-10-CM

## 2022-06-29 DIAGNOSIS — Z1211 Encounter for screening for malignant neoplasm of colon: Secondary | ICD-10-CM

## 2022-06-29 DIAGNOSIS — Z0000 Encounter for general adult medical examination without abnormal findings: Secondary | ICD-10-CM | POA: Diagnosis not present

## 2022-06-29 DIAGNOSIS — Z1231 Encounter for screening mammogram for malignant neoplasm of breast: Secondary | ICD-10-CM

## 2022-06-29 DIAGNOSIS — M79672 Pain in left foot: Secondary | ICD-10-CM | POA: Diagnosis not present

## 2022-06-29 LAB — CBC WITH DIFFERENTIAL/PLATELET
Basophils Absolute: 0 10*3/uL (ref 0.0–0.1)
Basophils Relative: 1 % (ref 0.0–3.0)
Eosinophils Absolute: 0.1 10*3/uL (ref 0.0–0.7)
Eosinophils Relative: 2.9 % (ref 0.0–5.0)
HCT: 41.1 % (ref 36.0–46.0)
Hemoglobin: 13.5 g/dL (ref 12.0–15.0)
Lymphocytes Relative: 35 % (ref 12.0–46.0)
Lymphs Abs: 1.7 10*3/uL (ref 0.7–4.0)
MCHC: 32.8 g/dL (ref 30.0–36.0)
MCV: 89.1 fl (ref 78.0–100.0)
Monocytes Absolute: 0.4 10*3/uL (ref 0.1–1.0)
Monocytes Relative: 8.5 % (ref 3.0–12.0)
Neutro Abs: 2.6 10*3/uL (ref 1.4–7.7)
Neutrophils Relative %: 52.6 % (ref 43.0–77.0)
Platelets: 263 10*3/uL (ref 150.0–400.0)
RBC: 4.61 Mil/uL (ref 3.87–5.11)
RDW: 14.2 % (ref 11.5–15.5)
WBC: 4.9 10*3/uL (ref 4.0–10.5)

## 2022-06-29 LAB — COMPREHENSIVE METABOLIC PANEL
ALT: 20 U/L (ref 0–35)
AST: 22 U/L (ref 0–37)
Albumin: 4.6 g/dL (ref 3.5–5.2)
Alkaline Phosphatase: 76 U/L (ref 39–117)
BUN: 17 mg/dL (ref 6–23)
CO2: 29 mEq/L (ref 19–32)
Calcium: 10.1 mg/dL (ref 8.4–10.5)
Chloride: 103 mEq/L (ref 96–112)
Creatinine, Ser: 0.7 mg/dL (ref 0.40–1.20)
GFR: 97.62 mL/min (ref >60.00)
Glucose, Bld: 86 mg/dL (ref 70–99)
Potassium: 4.2 mEq/L (ref 3.5–5.1)
Sodium: 140 mEq/L (ref 135–145)
Total Bilirubin: 0.6 mg/dL (ref 0.2–1.2)
Total Protein: 7.4 g/dL (ref 6.0–8.3)

## 2022-06-29 LAB — LIPID PANEL
Cholesterol: 234 mg/dL — ABNORMAL HIGH (ref 0–200)
HDL: 62.8 mg/dL (ref >39.00)
LDL Cholesterol: 143 mg/dL — ABNORMAL HIGH (ref 0–99)
NonHDL: 171.04
Total CHOL/HDL Ratio: 4
Triglycerides: 138 mg/dL (ref 0.0–149.0)
VLDL: 27.6 mg/dL (ref 0.0–40.0)

## 2022-06-29 LAB — TSH: TSH: 5.74 u[IU]/mL — ABNORMAL HIGH (ref 0.35–5.50)

## 2022-06-29 NOTE — Patient Instructions (Addendum)
It was great to see you!  We are checking your labs today and will let you know the results via mychart/phone.   We have you down for a mammogram on Monday 6/17 at 10:50am.   Roll your feet over a frozen ice pack for 10 minutes at the end of the day.   Let's follow-up in 6 months, sooner if you have concerns.  If a referral was placed today, you will be contacted for an appointment. Please note that routine referrals can sometimes take up to 3-4 weeks to process. Please call our office if you haven't heard anything after this time frame.  Take care,  Rodman Pickle, NP

## 2022-06-29 NOTE — Assessment & Plan Note (Signed)
Health maintenance reviewed and updated. Discussed nutrition, exercise. Check CMP, CBC, TSH today. Follow-up 1 year.   

## 2022-06-29 NOTE — Assessment & Plan Note (Signed)
Chronic, stable. Continue atorvastatin every few days as she is taking it. Check CMP, CBC, lipid panel today. Follow-up in 6 months.

## 2022-06-29 NOTE — Assessment & Plan Note (Addendum)
Chronic, stable. Continue with coping mechanisms at home. Follow-up with any concerns.

## 2022-06-29 NOTE — Progress Notes (Signed)
BP 104/70 (BP Location: Left Arm)   Pulse 75   Temp (!) 97.1 F (36.2 C)   Ht 5' 2.5" (1.588 m)   Wt 122 lb 12.8 oz (55.7 kg)   LMP 04/21/2019 Comment: spotting  SpO2 99%   BMI 22.10 kg/m    Subjective:    Patient ID: Rebecca Mcdowell, female    DOB: 02-Jan-1966, 57 y.o.   MRN: [id number]  CC: Chief Complaint  Patient presents with   Annual Exam    With fasting work    HPI: Rebecca Mcdowell is a 57 y.o. female presenting on 06/29/2022 for comprehensive medical examination. Current medical complaints include: feet pain  She has been having pain in her feet after sitting for long periods or laying down in bed. She thinks it may be related to arthritis. After walking for a few minutes, the pain goes away. She thinks the pain is on the bottom of her feet. This has been going on for the last 5-6 months. She denies injury.   She currently lives with: husband Menopausal Symptoms: yes - hot flashes  Depression and Anxiety Screen done today and results listed below:     06/29/2022    9:39 AM 03/05/2021   11:06 AM 03/13/2019   11:00 AM 12/22/2017   12:04 PM  Depression screen PHQ 2/9  Decreased Interest 0 0 0 0  Down, Depressed, Hopeless 1 0 0 0  PHQ - 2 Score 1 0 0 0  Altered sleeping 0 1  0  Tired, decreased energy 1 1  1   Change in appetite 0 0  0  Feeling bad or failure about yourself  0 0  1  Trouble concentrating 0 0  0  Moving slowly or fidgety/restless 0 0  0  Suicidal thoughts 0 0  0  PHQ-9 Score 2 2  2   Difficult doing work/chores  Somewhat difficult        06/29/2022    9:40 AM 03/05/2021   11:06 AM 12/22/2017   12:04 PM  GAD 7 : Generalized Anxiety Score  Nervous, Anxious, on Edge 1 0 1  Control/stop worrying 1 0 1  Worry too much - different things 1 1 0  Trouble relaxing 0 0 0  Restless 0 0 0  Easily annoyed or irritable 0 1 1  Afraid - awful might happen 0 1 1  Total GAD 7 Score 3 3 4   Anxiety Difficulty  Not difficult at all     The patient  does not have a history of falls. I did not complete a risk assessment for falls. A plan of care for falls was not documented.   Past Medical History:  Past Medical History:  Diagnosis Date   Abnormal Pap- s/p LEEP    Decreased hearing    Hyperlipidemia     Surgical History:  Past Surgical History:  Procedure Laterality Date   LEEP     OVARIAN CYST REMOVAL     2011, removed left ovary and fallopian tube    Medications:  Current Outpatient Medications on File Prior to Visit  Medication Sig   atorvastatin (LIPITOR) 40 MG tablet Take 1 tablet (40 mg total) by mouth daily. (Patient taking differently: Take 40 mg by mouth daily.)   No current facility-administered medications on file prior to visit.    Allergies:  No Known Allergies  Social History:  Social History   Socioeconomic History   Marital status: Married    Spouse name: Not  on file   Number of children: Not on file   Years of education: Not on file   Highest education level: Not on file  Occupational History   Not on file  Tobacco Use   Smoking status: Former    Years: 15    Types: Cigarettes   Smokeless tobacco: Never  Vaping Use   Vaping Use: Never used  Substance and Sexual Activity   Alcohol use: Yes    Comment: socially   Drug use: Never   Sexual activity: Not Currently    Partners: Male    Comment: Refused sexual hx question  Other Topics Concern   Not on file  Social History Narrative   Not on file   Social Determinants of Health   Financial Resource Strain: Not on file  Food Insecurity: Not on file  Transportation Needs: Not on file  Physical Activity: Not on file  Stress: Not on file  Social Connections: Not on file  Intimate Partner Violence: Not on file   Social History   Tobacco Use  Smoking Status Former   Years: 15   Types: Cigarettes  Smokeless Tobacco Never   Social History   Substance and Sexual Activity  Alcohol Use Yes   Comment: socially    Family History:   Family History  Problem Relation Age of Onset   Hypertension Mother    Hyperlipidemia Mother    Breast cancer Mother    Rheum arthritis Mother    Kidney failure Mother    Hypertension Father    Hyperlipidemia Father    Heart attack Paternal Grandmother    Hypertension Paternal Grandmother     Past medical history, surgical history, medications, allergies, family history and social history reviewed with patient today and changes made to appropriate areas of the chart.   Review of Systems  Constitutional: Negative.   HENT: Negative.    Eyes: Negative.   Respiratory: Negative.    Cardiovascular: Negative.   Gastrointestinal: Negative.   Genitourinary: Negative.   Musculoskeletal:  Positive for joint pain (feet, hip).  Skin: Negative.   Neurological: Negative.   Psychiatric/Behavioral: Negative.     All other ROS negative except what is listed above and in the HPI.      Objective:    BP 104/70 (BP Location: Left Arm)   Pulse 75   Temp (!) 97.1 F (36.2 C)   Ht 5' 2.5" (1.588 m)   Wt 122 lb 12.8 oz (55.7 kg)   LMP 04/21/2019 Comment: spotting  SpO2 99%   BMI 22.10 kg/m   Wt Readings from Last 3 Encounters:  06/29/22 122 lb 12.8 oz (55.7 kg)  03/04/22 119 lb (54 kg)  01/03/22 115 lb 8 oz (52.4 kg)    Physical Exam Vitals and nursing note reviewed.  Constitutional:      General: She is not in acute distress.    Appearance: Normal appearance.  HENT:     Head: Normocephalic and atraumatic.     Right Ear: Tympanic membrane, ear canal and external ear normal.     Left Ear: Tympanic membrane, ear canal and external ear normal.  Eyes:     Conjunctiva/sclera: Conjunctivae normal.  Cardiovascular:     Rate and Rhythm: Normal rate and regular rhythm.     Pulses: Normal pulses.     Heart sounds: Normal heart sounds.  Pulmonary:     Effort: Pulmonary effort is normal.     Breath sounds: Normal breath sounds.  Abdominal:  Palpations: Abdomen is soft.      Tenderness: There is no abdominal tenderness.  Musculoskeletal:        General: No tenderness. Normal range of motion.     Cervical back: Normal range of motion and neck supple.     Right lower leg: No edema.     Left lower leg: No edema.  Lymphadenopathy:     Cervical: No cervical adenopathy.  Skin:    General: Skin is warm and dry.  Neurological:     General: No focal deficit present.     Mental Status: She is alert and oriented to person, place, and time.     Coordination: Coordination normal.     Gait: Gait normal.  Psychiatric:        Mood and Affect: Mood normal.        Behavior: Behavior normal.        Thought Content: Thought content normal.        Judgment: Judgment normal.     Results for orders placed or performed in visit on 07/16/21  Comprehensive metabolic panel  Result Value Ref Range   Sodium 141 135 - 145 mEq/L   Potassium 4.1 3.5 - 5.1 mEq/L   Chloride 106 96 - 112 mEq/L   CO2 29 19 - 32 mEq/L   Glucose, Bld 77 70 - 99 mg/dL   BUN 22 6 - 23 mg/dL   Creatinine, Ser 1.47 0.40 - 1.20 mg/dL   Total Bilirubin 0.4 0.2 - 1.2 mg/dL   Alkaline Phosphatase 73 39 - 117 U/L   AST 18 0 - 37 U/L   ALT 21 0 - 35 U/L   Total Protein 7.5 6.0 - 8.3 g/dL   Albumin 4.6 3.5 - 5.2 g/dL   GFR 82.95 >62.13 mL/min   Calcium 9.9 8.4 - 10.5 mg/dL  Lipid panel  Result Value Ref Range   Cholesterol 214 (H) 0 - 200 mg/dL   Triglycerides 08.6 0.0 - 149.0 mg/dL   HDL 57.84 >69.62 mg/dL   VLDL 95.2 0.0 - 84.1 mg/dL   LDL Cholesterol 324 (H) 0 - 99 mg/dL   Total CHOL/HDL Ratio 3    NonHDL 147.43       Assessment & Plan:   Problem List Items Addressed This Visit       Other   Generalized anxiety disorder    Chronic, stable. Continue with coping mechanisms at home. Follow-up with any concerns.       Hyperlipidemia    Chronic, stable. Continue atorvastatin every few days as she is taking it. Check CMP, CBC, lipid panel today. Follow-up in 6 months.       Relevant Orders    CBC with Differential/Platelet   Comprehensive metabolic panel   Lipid panel   Routine general medical examination at a health care facility - Primary    Health maintenance reviewed and updated. Discussed nutrition, exercise. Check CMP, CBC, TSH today. Follow-up 1 year.        Relevant Orders   CBC with Differential/Platelet   Comprehensive metabolic panel   TSH   Pain in both feet    She has been experiencing bilateral feet pain when she wakes up in the morning.  From her description, it sounds like plantar fasciitis.  Encouraged her to stretch her feet, she can use ice, especially at night.  Follow-up if symptoms worsen or do not improve.      Other Visit Diagnoses     Encounter for screening mammogram  for malignant neoplasm of breast       Mammogram scheduled for 07/04/22 at 10:50am at mobile mammogram at [facility]   Relevant Orders   MM 3D SCREENING MAMMOGRAM BILATERAL BREAST   Screen for colon cancer       Referral placed to GI for colon cancer screening.   Relevant Orders   Ambulatory referral to Gastroenterology        Follow up plan: Return in about 6 months (around 12/29/2022) for HLD.   LABORATORY TESTING:  - Pap smear: up to date  IMMUNIZATIONS:   - Tdap: Tetanus vaccination status reviewed: last tetanus booster within 10 years. - Influenza: Postponed to flu season - Pneumovax: Not applicable - Prevnar: Not applicable - HPV: Not applicable - Zostavax vaccine: Up to date  SCREENING: -Mammogram: Ordered today  - Colonoscopy: Ordered today  - Bone Density: Not applicable   PATIENT COUNSELING:   Advised to take 1 mg of folate supplement per day if capable of pregnancy.   Sexuality: Discussed sexually transmitted diseases, partner selection, use of condoms, avoidance of unintended pregnancy  and contraceptive alternatives.   Advised to avoid cigarette smoking.  I discussed with the patient that most people either abstain from alcohol or drink within  safe limits (<=14/week and <=4 drinks/occasion for males, <=7/weeks and <= 3 drinks/occasion for females) and that the risk for alcohol disorders and other health effects rises proportionally with the number of drinks per week and how often a drinker exceeds daily limits.  Discussed cessation/primary prevention of drug use and availability of treatment for abuse.   Diet: Encouraged to adjust caloric intake to maintain  or achieve ideal body weight, to reduce intake of dietary saturated fat and total fat, to limit sodium intake by avoiding high sodium foods and not adding table salt, and to maintain adequate dietary potassium and calcium preferably from fresh fruits, vegetables, and low-fat dairy products.    stressed the importance of regular exercise  Injury prevention: Discussed safety belts, safety helmets, smoke detector, smoking near bedding or upholstery.   Dental health: Discussed importance of regular tooth brushing, flossing, and dental visits.    NEXT PREVENTATIVE PHYSICAL DUE IN 1 YEAR. Return in about 6 months (around 12/29/2022) for HLD.

## 2022-06-29 NOTE — Assessment & Plan Note (Signed)
She has been experiencing bilateral feet pain when she wakes up in the morning.  From her description, it sounds like plantar fasciitis.  Encouraged her to stretch her feet, she can use ice, especially at night.  Follow-up if symptoms worsen or do not improve.

## 2022-06-30 MED ORDER — ATORVASTATIN CALCIUM 80 MG PO TABS: 80.0000 mg | ORAL_TABLET | Freq: Every day | ORAL | 1 refills | Status: AC

## 2022-06-30 NOTE — Addendum Note (Signed)
Addended by: Rodman Pickle A on: 06/30/2022 08:04 AM   Modules accepted: Orders

## 2022-07-04 ENCOUNTER — Ambulatory Visit
Admission: RE | Admit: 2022-07-04 | Discharge: 2022-07-04 | Disposition: A | Discharge status: Home / Self Care | Payer: 59 | Source: Ambulatory Visit | Attending: Nurse Practitioner | Admitting: Nurse Practitioner

## 2022-07-04 DIAGNOSIS — Z1231 Encounter for screening mammogram for malignant neoplasm of breast: Secondary | ICD-10-CM

## 2022-07-08 ENCOUNTER — Encounter: Payer: 59 | Admitting: Nurse Practitioner

## 2022-09-01 ENCOUNTER — Encounter (INDEPENDENT_AMBULATORY_CARE_PROVIDER_SITE_OTHER): Payer: Self-pay

## 2022-09-29 ENCOUNTER — Ambulatory Visit (INDEPENDENT_AMBULATORY_CARE_PROVIDER_SITE_OTHER): Payer: 59 | Admitting: Nurse Practitioner

## 2022-09-29 ENCOUNTER — Encounter: Payer: Self-pay | Admitting: Nurse Practitioner

## 2022-09-29 VITALS — BP 124/80 | HR 79 | Temp 97.9°F | Ht 62.5 in | Wt 126.0 lb

## 2022-09-29 DIAGNOSIS — R7989 Other specified abnormal findings of blood chemistry: Secondary | ICD-10-CM | POA: Diagnosis not present

## 2022-09-29 DIAGNOSIS — E7800 Pure hypercholesterolemia, unspecified: Secondary | ICD-10-CM | POA: Diagnosis not present

## 2022-09-29 DIAGNOSIS — Z23 Encounter for immunization: Secondary | ICD-10-CM

## 2022-09-29 LAB — COMPREHENSIVE METABOLIC PANEL
ALT: 19 U/L (ref 0–35)
AST: 19 U/L (ref 0–37)
Albumin: 4.6 g/dL (ref 3.5–5.2)
Alkaline Phosphatase: 76 U/L (ref 39–117)
BUN: 23 mg/dL (ref 6–23)
CO2: 27 meq/L (ref 19–32)
Calcium: 10.1 mg/dL (ref 8.4–10.5)
Chloride: 104 meq/L (ref 96–112)
Creatinine, Ser: 0.7 mg/dL (ref 0.40–1.20)
GFR: 97.44 mL/min (ref >60.00)
Glucose, Bld: 92 mg/dL (ref 70–99)
Potassium: 4.7 meq/L (ref 3.5–5.1)
Sodium: 141 meq/L (ref 135–145)
Total Bilirubin: 0.6 mg/dL (ref 0.2–1.2)
Total Protein: 7.4 g/dL (ref 6.0–8.3)

## 2022-09-29 LAB — CBC WITH DIFFERENTIAL/PLATELET
Basophils Absolute: 0.1 10*3/uL (ref 0.0–0.1)
Basophils Relative: 1.1 % (ref 0.0–3.0)
Eosinophils Absolute: 0 10*3/uL (ref 0.0–0.7)
Eosinophils Relative: 0.9 % (ref 0.0–5.0)
HCT: 41.9 % (ref 36.0–46.0)
Hemoglobin: 13.7 g/dL (ref 12.0–15.0)
Lymphocytes Relative: 27.1 % (ref 12.0–46.0)
Lymphs Abs: 1.3 10*3/uL (ref 0.7–4.0)
MCHC: 32.8 g/dL (ref 30.0–36.0)
MCV: 90.8 fl (ref 78.0–100.0)
Monocytes Absolute: 0.4 10*3/uL (ref 0.1–1.0)
Monocytes Relative: 7.7 % (ref 3.0–12.0)
Neutro Abs: 3.1 10*3/uL (ref 1.4–7.7)
Neutrophils Relative %: 63.2 % (ref 43.0–77.0)
Platelets: 266 10*3/uL (ref 150.0–400.0)
RBC: 4.62 Mil/uL (ref 3.87–5.11)
RDW: 14.1 % (ref 11.5–15.5)
WBC: 4.9 10*3/uL (ref 4.0–10.5)

## 2022-09-29 LAB — LIPID PANEL
Cholesterol: 233 mg/dL — ABNORMAL HIGH (ref 0–200)
HDL: 66.3 mg/dL (ref >39.00)
LDL Cholesterol: 143 mg/dL — ABNORMAL HIGH (ref 0–99)
NonHDL: 166.62
Total CHOL/HDL Ratio: 4
Triglycerides: 117 mg/dL (ref 0.0–149.0)
VLDL: 23.4 mg/dL (ref 0.0–40.0)

## 2022-09-29 NOTE — Progress Notes (Signed)
   Established Patient Office Visit  Subjective   Patient ID: Rebecca Mcdowell, female    DOB: 1965-09-28  Age: 57 y.o. MRN: [id number]  Chief Complaint  Patient presents with   Hyperlipidemia    Follow up and fasting lab work   HPI:  Rebecca Mcdowell is here to follow-up on hyperlipidemia and elevated TSH.   Last visit, her TSH was noted to be elevated at 5.74. She notes that she feels fatigue at times and notes that it's hard to lose weight.   She notes that she takes her cholesterol medication a few times a week. She denies chest pain and shortness of breath. She is not exercising regularly.      ROS See pertinent positives and negatives per HPI.    Objective:     BP 124/80 (BP Location: Left Arm)   Pulse 79   Temp 97.9 F (36.6 C)   Ht 5' 2.5" (1.588 m)   Wt 126 lb (57.2 kg)   LMP 04/21/2019 Comment: spotting  SpO2 98%   BMI 22.68 kg/m    Physical Exam Vitals and nursing note reviewed.  Constitutional:      General: She is not in acute distress.    Appearance: Normal appearance.  HENT:     Head: Normocephalic.  Eyes:     Conjunctiva/sclera: Conjunctivae normal.  Cardiovascular:     Rate and Rhythm: Normal rate and regular rhythm.     Pulses: Normal pulses.     Heart sounds: Normal heart sounds.  Pulmonary:     Effort: Pulmonary effort is normal.     Breath sounds: Normal breath sounds.  Musculoskeletal:     Cervical back: Normal range of motion.  Skin:    General: Skin is warm.  Neurological:     General: No focal deficit present.     Mental Status: She is alert and oriented to person, place, and time.  Psychiatric:        Mood and Affect: Mood normal.        Behavior: Behavior normal.        Thought Content: Thought content normal.        Judgment: Judgment normal.    The 10-year ASCVD risk score (Arnett DK, et al., 2019) is: 1.9%    Assessment & Plan:   Problem List Items Addressed This Visit       Other   Hyperlipidemia -  Primary    Chronic, ongoing. She has increased the dose of her atorvastatin to 80mg , but still has a hard time remembering to take it every day. 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 Check CMP, CBC, lipid panel today.       Relevant Orders   Lipid panel   CBC with Differential/Platelet   Comprehensive metabolic panel   Elevated TSH    Check thyroid panel and TPO antibodies today.       Relevant Orders   Thyroid Peroxidase Antibodies (TPO) (REFL)   Thyroid Panel With TSH   Other Visit Diagnoses     Immunization due       Flu vaccine given today   Relevant Orders   Flu vaccine trivalent PF, 6mos and older(Flulaval,Afluria,Fluarix,Fluzone)       Return in about 6 months (around 03/29/2023), or if symptoms worsen or fail to improve, for HLD.    Gerre Scull, NP

## 2022-09-29 NOTE — Patient Instructions (Signed)
It was great to see you!  We are checking your labs today and will let you know the results via mychart/phone.   You can look at repatha, an injection every 2 weeks for your cholesterol.   Let's follow-up based on lab results.   Take care,  Rodman Pickle, NP

## 2022-09-29 NOTE — Assessment & Plan Note (Signed)
Check thyroid panel and TPO antibodies today.

## 2022-09-29 NOTE — Assessment & Plan Note (Signed)
Chronic, ongoing. She has increased the dose of her atorvastatin to 80mg , but still has a hard time remembering to take it every day. 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 Check CMP, CBC, lipid panel today.

## 2022-10-03 LAB — THYROID PEROXIDASE ANTIBODIES (TPO) (REFL): Thyroperoxidase Ab SerPl-aCnc: <1 [IU]/mL (ref <9)

## 2022-10-03 LAB — THYROID PANEL WITH TSH
Free Thyroxine Index: 2.1 (ref 1.4–3.8)
T3 Uptake: 25 % (ref 22–35)
T4, Total: 8.5 ug/dL (ref 5.1–11.9)
TSH: 5.88 m[IU]/L — ABNORMAL HIGH

## 2022-12-29 ENCOUNTER — Ambulatory Visit: Payer: 59 | Admitting: Nurse Practitioner

## 2023-03-22 ENCOUNTER — Ambulatory Visit: Payer: 59 | Admitting: Nurse Practitioner

## 2024-02-15 ENCOUNTER — Telehealth: Payer: Self-pay

## 2024-02-15 NOTE — Telephone Encounter (Signed)
 I called and spoke with patient and she would like to have image done at the less expensive place. She last had mammogram done here at the mobile unit.

## 2024-02-15 NOTE — Telephone Encounter (Signed)
 Copied from CRM #[id number]. Topic: Clinical - Medical Advice >> Feb 15, 2024 11:41 AM Vena HERO wrote: Reason for CRM: Pt called in because she is wanting to get a mammogram done but doesn't know if she can schedule directly or if she needs a referral. Verified insurance on file is still the same. Please call pt to advise

## 2024-02-16 NOTE — Telephone Encounter (Signed)
 Left patient a detailed message to call the office back so that we can schedule her a mammogram.

## 2024-02-20 NOTE — Telephone Encounter (Signed)
 Patient scheduled for mammogram for 03/04/24 at 310pm.

## 2024-03-04 ENCOUNTER — Encounter
